# Patient Record
Sex: Male | Born: 1955 | Race: White | Hispanic: No | Marital: Married | Smoking: Never smoker
Health system: Southern US, Community
[De-identification: ages and names within clinical notes are randomized; demographics above are authoritative.]

## PROBLEM LIST (undated history)

## (undated) DIAGNOSIS — E782 Mixed hyperlipidemia: Secondary | ICD-10-CM

## (undated) DIAGNOSIS — U071 COVID-19: Secondary | ICD-10-CM

## (undated) DIAGNOSIS — G4733 Obstructive sleep apnea (adult) (pediatric): Secondary | ICD-10-CM

## (undated) DIAGNOSIS — D6851 Activated protein C resistance: Secondary | ICD-10-CM

## (undated) DIAGNOSIS — J8410 Pulmonary fibrosis, unspecified: Secondary | ICD-10-CM

## (undated) HISTORY — PX: ANKLE SURGERY: SHX546

## (undated) HISTORY — PX: TRACHELECTOMY: SHX6586

## (undated) HISTORY — PX: HERNIA REPAIR: SHX51

## (undated) HISTORY — PX: KNEE SURGERY: SHX244

---
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
## 2001-05-26 HISTORY — PX: INGUINAL HERNIA REPAIR: SUR1180

## 2015-05-27 HISTORY — PX: COLONOSCOPY: SHX174

## 2016-09-01 DIAGNOSIS — M1711 Unilateral primary osteoarthritis, right knee: Secondary | ICD-10-CM | POA: Insufficient documentation

## 2017-04-27 HISTORY — PX: KNEE ARTHROSCOPY W/ MENISCECTOMY: SHX1879

## 2017-05-14 DIAGNOSIS — Z4789 Encounter for other orthopedic aftercare: Secondary | ICD-10-CM | POA: Insufficient documentation

## 2018-05-08 DIAGNOSIS — G4700 Insomnia, unspecified: Secondary | ICD-10-CM | POA: Diagnosis not present

## 2018-05-08 DIAGNOSIS — I10 Essential (primary) hypertension: Secondary | ICD-10-CM | POA: Diagnosis not present

## 2018-05-08 DIAGNOSIS — Z79899 Other long term (current) drug therapy: Secondary | ICD-10-CM | POA: Diagnosis not present

## 2018-06-05 DIAGNOSIS — I10 Essential (primary) hypertension: Secondary | ICD-10-CM | POA: Diagnosis not present

## 2018-06-05 DIAGNOSIS — G4700 Insomnia, unspecified: Secondary | ICD-10-CM | POA: Diagnosis not present

## 2018-06-05 DIAGNOSIS — Z79899 Other long term (current) drug therapy: Secondary | ICD-10-CM | POA: Diagnosis not present

## 2018-11-22 DIAGNOSIS — M545 Low back pain: Secondary | ICD-10-CM | POA: Diagnosis not present

## 2019-01-28 DIAGNOSIS — Z23 Encounter for immunization: Secondary | ICD-10-CM | POA: Diagnosis not present

## 2019-01-28 DIAGNOSIS — R21 Rash and other nonspecific skin eruption: Secondary | ICD-10-CM | POA: Diagnosis not present

## 2019-01-28 DIAGNOSIS — E291 Testicular hypofunction: Secondary | ICD-10-CM | POA: Diagnosis not present

## 2019-01-28 DIAGNOSIS — L258 Unspecified contact dermatitis due to other agents: Secondary | ICD-10-CM | POA: Diagnosis not present

## 2019-04-02 DIAGNOSIS — Z114 Encounter for screening for human immunodeficiency virus [HIV]: Secondary | ICD-10-CM | POA: Diagnosis not present

## 2019-04-02 DIAGNOSIS — Z131 Encounter for screening for diabetes mellitus: Secondary | ICD-10-CM | POA: Diagnosis not present

## 2019-04-02 DIAGNOSIS — Z13220 Encounter for screening for lipoid disorders: Secondary | ICD-10-CM | POA: Diagnosis not present

## 2019-04-02 DIAGNOSIS — R5383 Other fatigue: Secondary | ICD-10-CM | POA: Diagnosis not present

## 2019-04-02 DIAGNOSIS — E559 Vitamin D deficiency, unspecified: Secondary | ICD-10-CM | POA: Diagnosis not present

## 2019-04-02 DIAGNOSIS — Z0000 Encounter for general adult medical examination without abnormal findings: Secondary | ICD-10-CM | POA: Diagnosis not present

## 2019-04-02 DIAGNOSIS — R0602 Shortness of breath: Secondary | ICD-10-CM | POA: Diagnosis not present

## 2019-04-02 DIAGNOSIS — Z125 Encounter for screening for malignant neoplasm of prostate: Secondary | ICD-10-CM | POA: Diagnosis not present

## 2019-04-02 DIAGNOSIS — Z1159 Encounter for screening for other viral diseases: Secondary | ICD-10-CM | POA: Diagnosis not present

## 2019-05-17 DIAGNOSIS — L218 Other seborrheic dermatitis: Secondary | ICD-10-CM | POA: Diagnosis not present

## 2019-05-17 DIAGNOSIS — L820 Inflamed seborrheic keratosis: Secondary | ICD-10-CM | POA: Diagnosis not present

## 2019-05-17 DIAGNOSIS — D485 Neoplasm of uncertain behavior of skin: Secondary | ICD-10-CM | POA: Diagnosis not present

## 2020-01-25 DIAGNOSIS — A4189 Other specified sepsis: Secondary | ICD-10-CM

## 2020-01-25 HISTORY — DX: COVID-19: A41.89

## 2020-01-28 DIAGNOSIS — R918 Other nonspecific abnormal finding of lung field: Secondary | ICD-10-CM | POA: Diagnosis not present

## 2020-01-29 DIAGNOSIS — E870 Hyperosmolality and hypernatremia: Secondary | ICD-10-CM | POA: Diagnosis not present

## 2020-01-29 DIAGNOSIS — Z9911 Dependence on respirator [ventilator] status: Secondary | ICD-10-CM | POA: Diagnosis not present

## 2020-01-29 DIAGNOSIS — J9601 Acute respiratory failure with hypoxia: Secondary | ICD-10-CM | POA: Diagnosis not present

## 2020-01-29 DIAGNOSIS — U071 COVID-19: Secondary | ICD-10-CM

## 2020-01-29 DIAGNOSIS — I7789 Other specified disorders of arteries and arterioles: Secondary | ICD-10-CM | POA: Diagnosis not present

## 2020-01-29 DIAGNOSIS — R6521 Severe sepsis with septic shock: Secondary | ICD-10-CM | POA: Diagnosis not present

## 2020-01-29 DIAGNOSIS — I808 Phlebitis and thrombophlebitis of other sites: Secondary | ICD-10-CM | POA: Diagnosis not present

## 2020-01-29 DIAGNOSIS — R9431 Abnormal electrocardiogram [ECG] [EKG]: Secondary | ICD-10-CM | POA: Diagnosis not present

## 2020-01-29 DIAGNOSIS — J9690 Respiratory failure, unspecified, unspecified whether with hypoxia or hypercapnia: Secondary | ICD-10-CM | POA: Diagnosis not present

## 2020-01-29 DIAGNOSIS — A4189 Other specified sepsis: Secondary | ICD-10-CM | POA: Diagnosis not present

## 2020-01-29 DIAGNOSIS — D682 Hereditary deficiency of other clotting factors: Secondary | ICD-10-CM | POA: Diagnosis not present

## 2020-01-29 DIAGNOSIS — N19 Unspecified kidney failure: Secondary | ICD-10-CM | POA: Diagnosis not present

## 2020-01-29 DIAGNOSIS — R000 Tachycardia, unspecified: Secondary | ICD-10-CM | POA: Diagnosis not present

## 2020-01-29 DIAGNOSIS — Z930 Tracheostomy status: Secondary | ICD-10-CM | POA: Diagnosis not present

## 2020-01-29 DIAGNOSIS — U099 Post covid-19 condition, unspecified: Secondary | ICD-10-CM | POA: Diagnosis not present

## 2020-01-29 DIAGNOSIS — R579 Shock, unspecified: Secondary | ICD-10-CM | POA: Diagnosis not present

## 2020-01-29 DIAGNOSIS — Z9981 Dependence on supplemental oxygen: Secondary | ICD-10-CM | POA: Diagnosis not present

## 2020-01-29 DIAGNOSIS — K5900 Constipation, unspecified: Secondary | ICD-10-CM | POA: Diagnosis not present

## 2020-01-29 DIAGNOSIS — R0902 Hypoxemia: Secondary | ICD-10-CM | POA: Diagnosis not present

## 2020-01-29 DIAGNOSIS — N179 Acute kidney failure, unspecified: Secondary | ICD-10-CM | POA: Diagnosis not present

## 2020-01-29 DIAGNOSIS — G928 Other toxic encephalopathy: Secondary | ICD-10-CM | POA: Diagnosis not present

## 2020-01-29 DIAGNOSIS — J90 Pleural effusion, not elsewhere classified: Secondary | ICD-10-CM | POA: Diagnosis not present

## 2020-01-29 DIAGNOSIS — Z781 Physical restraint status: Secondary | ICD-10-CM | POA: Diagnosis not present

## 2020-01-29 DIAGNOSIS — R918 Other nonspecific abnormal finding of lung field: Secondary | ICD-10-CM | POA: Diagnosis not present

## 2020-01-29 DIAGNOSIS — R0789 Other chest pain: Secondary | ICD-10-CM | POA: Diagnosis not present

## 2020-01-29 DIAGNOSIS — J80 Acute respiratory distress syndrome: Secondary | ICD-10-CM | POA: Diagnosis not present

## 2020-01-29 DIAGNOSIS — R079 Chest pain, unspecified: Secondary | ICD-10-CM | POA: Diagnosis not present

## 2020-01-29 DIAGNOSIS — Z978 Presence of other specified devices: Secondary | ICD-10-CM | POA: Diagnosis not present

## 2020-01-29 DIAGNOSIS — Z4659 Encounter for fitting and adjustment of other gastrointestinal appliance and device: Secondary | ICD-10-CM | POA: Diagnosis not present

## 2020-01-29 DIAGNOSIS — L89152 Pressure ulcer of sacral region, stage 2: Secondary | ICD-10-CM | POA: Diagnosis not present

## 2020-01-29 DIAGNOSIS — G4733 Obstructive sleep apnea (adult) (pediatric): Secondary | ICD-10-CM | POA: Diagnosis not present

## 2020-01-29 DIAGNOSIS — J95851 Ventilator associated pneumonia: Secondary | ICD-10-CM | POA: Diagnosis not present

## 2020-01-29 DIAGNOSIS — K6389 Other specified diseases of intestine: Secondary | ICD-10-CM | POA: Diagnosis not present

## 2020-01-29 DIAGNOSIS — E874 Mixed disorder of acid-base balance: Secondary | ICD-10-CM | POA: Diagnosis not present

## 2020-01-29 DIAGNOSIS — R7303 Prediabetes: Secondary | ICD-10-CM | POA: Diagnosis not present

## 2020-01-29 DIAGNOSIS — B9689 Other specified bacterial agents as the cause of diseases classified elsewhere: Secondary | ICD-10-CM | POA: Diagnosis not present

## 2020-01-29 DIAGNOSIS — R0602 Shortness of breath: Secondary | ICD-10-CM | POA: Diagnosis not present

## 2020-01-29 DIAGNOSIS — Z006 Encounter for examination for normal comparison and control in clinical research program: Secondary | ICD-10-CM | POA: Diagnosis not present

## 2020-01-29 DIAGNOSIS — J690 Pneumonitis due to inhalation of food and vomit: Secondary | ICD-10-CM | POA: Diagnosis not present

## 2020-01-29 DIAGNOSIS — D6851 Activated protein C resistance: Secondary | ICD-10-CM | POA: Diagnosis not present

## 2020-01-29 DIAGNOSIS — J1282 Pneumonia due to coronavirus disease 2019: Secondary | ICD-10-CM | POA: Diagnosis not present

## 2020-01-29 DIAGNOSIS — A419 Sepsis, unspecified organism: Secondary | ICD-10-CM | POA: Diagnosis not present

## 2020-01-29 DIAGNOSIS — J15211 Pneumonia due to Methicillin susceptible Staphylococcus aureus: Secondary | ICD-10-CM | POA: Diagnosis not present

## 2020-01-29 HISTORY — DX: Acute respiratory failure with hypoxia: J96.01

## 2020-01-29 HISTORY — DX: COVID-19: U07.1

## 2020-01-31 DIAGNOSIS — G4733 Obstructive sleep apnea (adult) (pediatric): Secondary | ICD-10-CM | POA: Insufficient documentation

## 2020-01-31 DIAGNOSIS — J9601 Acute respiratory failure with hypoxia: Secondary | ICD-10-CM | POA: Insufficient documentation

## 2020-02-01 DIAGNOSIS — A4189 Other specified sepsis: Secondary | ICD-10-CM | POA: Insufficient documentation

## 2020-02-07 DIAGNOSIS — J15211 Pneumonia due to Methicillin susceptible Staphylococcus aureus: Secondary | ICD-10-CM | POA: Insufficient documentation

## 2020-02-07 DIAGNOSIS — N179 Acute kidney failure, unspecified: Secondary | ICD-10-CM | POA: Insufficient documentation

## 2020-02-25 HISTORY — PX: TRACHEOSTOMY: SUR1362

## 2020-03-19 DIAGNOSIS — J398 Other specified diseases of upper respiratory tract: Secondary | ICD-10-CM | POA: Diagnosis not present

## 2020-03-19 DIAGNOSIS — R0902 Hypoxemia: Secondary | ICD-10-CM | POA: Diagnosis not present

## 2020-03-19 DIAGNOSIS — R54 Age-related physical debility: Secondary | ICD-10-CM | POA: Diagnosis not present

## 2020-03-19 DIAGNOSIS — Z7401 Bed confinement status: Secondary | ICD-10-CM | POA: Diagnosis not present

## 2020-03-19 DIAGNOSIS — G929 Unspecified toxic encephalopathy: Secondary | ICD-10-CM | POA: Diagnosis not present

## 2020-03-19 DIAGNOSIS — D638 Anemia in other chronic diseases classified elsewhere: Secondary | ICD-10-CM | POA: Diagnosis not present

## 2020-03-19 DIAGNOSIS — I5031 Acute diastolic (congestive) heart failure: Secondary | ICD-10-CM | POA: Diagnosis not present

## 2020-03-19 DIAGNOSIS — Z9981 Dependence on supplemental oxygen: Secondary | ICD-10-CM | POA: Diagnosis not present

## 2020-03-19 DIAGNOSIS — M2550 Pain in unspecified joint: Secondary | ICD-10-CM | POA: Diagnosis not present

## 2020-03-19 DIAGNOSIS — J849 Interstitial pulmonary disease, unspecified: Secondary | ICD-10-CM | POA: Diagnosis not present

## 2020-03-19 DIAGNOSIS — R5381 Other malaise: Secondary | ICD-10-CM | POA: Diagnosis not present

## 2020-03-19 DIAGNOSIS — G9340 Encephalopathy, unspecified: Secondary | ICD-10-CM | POA: Diagnosis not present

## 2020-03-19 DIAGNOSIS — J80 Acute respiratory distress syndrome: Secondary | ICD-10-CM | POA: Diagnosis not present

## 2020-03-19 DIAGNOSIS — J9621 Acute and chronic respiratory failure with hypoxia: Secondary | ICD-10-CM | POA: Diagnosis not present

## 2020-03-19 DIAGNOSIS — R911 Solitary pulmonary nodule: Secondary | ICD-10-CM | POA: Diagnosis not present

## 2020-03-19 DIAGNOSIS — G4733 Obstructive sleep apnea (adult) (pediatric): Secondary | ICD-10-CM | POA: Diagnosis not present

## 2020-03-19 DIAGNOSIS — J984 Other disorders of lung: Secondary | ICD-10-CM | POA: Diagnosis not present

## 2020-03-19 DIAGNOSIS — J9620 Acute and chronic respiratory failure, unspecified whether with hypoxia or hypercapnia: Secondary | ICD-10-CM | POA: Diagnosis not present

## 2020-03-19 DIAGNOSIS — G928 Other toxic encephalopathy: Secondary | ICD-10-CM | POA: Diagnosis not present

## 2020-03-19 DIAGNOSIS — J988 Other specified respiratory disorders: Secondary | ICD-10-CM | POA: Diagnosis not present

## 2020-03-19 DIAGNOSIS — L89152 Pressure ulcer of sacral region, stage 2: Secondary | ICD-10-CM | POA: Diagnosis not present

## 2020-03-19 DIAGNOSIS — Z930 Tracheostomy status: Secondary | ICD-10-CM | POA: Diagnosis not present

## 2020-03-19 DIAGNOSIS — J1282 Pneumonia due to coronavirus disease 2019: Secondary | ICD-10-CM | POA: Diagnosis not present

## 2020-03-19 DIAGNOSIS — J95851 Ventilator associated pneumonia: Secondary | ICD-10-CM | POA: Diagnosis not present

## 2020-03-19 DIAGNOSIS — U071 COVID-19: Secondary | ICD-10-CM | POA: Diagnosis not present

## 2020-03-19 DIAGNOSIS — Z9989 Dependence on other enabling machines and devices: Secondary | ICD-10-CM | POA: Diagnosis not present

## 2020-03-19 DIAGNOSIS — R1310 Dysphagia, unspecified: Secondary | ICD-10-CM | POA: Diagnosis not present

## 2020-03-19 DIAGNOSIS — A4189 Other specified sepsis: Secondary | ICD-10-CM | POA: Diagnosis not present

## 2020-03-19 DIAGNOSIS — U099 Post covid-19 condition, unspecified: Secondary | ICD-10-CM | POA: Diagnosis not present

## 2020-03-19 DIAGNOSIS — R1312 Dysphagia, oropharyngeal phase: Secondary | ICD-10-CM | POA: Diagnosis not present

## 2020-03-20 DIAGNOSIS — G4733 Obstructive sleep apnea (adult) (pediatric): Secondary | ICD-10-CM

## 2020-03-20 DIAGNOSIS — U071 COVID-19: Secondary | ICD-10-CM

## 2020-03-20 DIAGNOSIS — J9621 Acute and chronic respiratory failure with hypoxia: Secondary | ICD-10-CM

## 2020-03-20 DIAGNOSIS — R911 Solitary pulmonary nodule: Secondary | ICD-10-CM | POA: Diagnosis not present

## 2020-03-21 DIAGNOSIS — U071 COVID-19: Secondary | ICD-10-CM

## 2020-03-21 DIAGNOSIS — J9621 Acute and chronic respiratory failure with hypoxia: Secondary | ICD-10-CM

## 2020-03-21 DIAGNOSIS — G4733 Obstructive sleep apnea (adult) (pediatric): Secondary | ICD-10-CM

## 2020-03-21 DIAGNOSIS — R911 Solitary pulmonary nodule: Secondary | ICD-10-CM

## 2020-03-22 DIAGNOSIS — J9621 Acute and chronic respiratory failure with hypoxia: Secondary | ICD-10-CM | POA: Diagnosis not present

## 2020-03-22 DIAGNOSIS — G4733 Obstructive sleep apnea (adult) (pediatric): Secondary | ICD-10-CM

## 2020-03-22 DIAGNOSIS — U071 COVID-19: Secondary | ICD-10-CM

## 2020-03-22 DIAGNOSIS — R911 Solitary pulmonary nodule: Secondary | ICD-10-CM

## 2020-03-23 DIAGNOSIS — U071 COVID-19: Secondary | ICD-10-CM | POA: Diagnosis not present

## 2020-03-23 DIAGNOSIS — R911 Solitary pulmonary nodule: Secondary | ICD-10-CM | POA: Diagnosis not present

## 2020-03-23 DIAGNOSIS — G4733 Obstructive sleep apnea (adult) (pediatric): Secondary | ICD-10-CM

## 2020-03-23 DIAGNOSIS — J9621 Acute and chronic respiratory failure with hypoxia: Secondary | ICD-10-CM | POA: Diagnosis not present

## 2020-04-02 DIAGNOSIS — G4733 Obstructive sleep apnea (adult) (pediatric): Secondary | ICD-10-CM | POA: Diagnosis not present

## 2020-04-02 DIAGNOSIS — J9621 Acute and chronic respiratory failure with hypoxia: Secondary | ICD-10-CM

## 2020-04-02 DIAGNOSIS — U071 COVID-19: Secondary | ICD-10-CM | POA: Diagnosis not present

## 2020-04-02 DIAGNOSIS — R911 Solitary pulmonary nodule: Secondary | ICD-10-CM | POA: Diagnosis not present

## 2020-04-03 DIAGNOSIS — U071 COVID-19: Secondary | ICD-10-CM | POA: Diagnosis not present

## 2020-04-03 DIAGNOSIS — J9621 Acute and chronic respiratory failure with hypoxia: Secondary | ICD-10-CM

## 2020-04-03 DIAGNOSIS — G4733 Obstructive sleep apnea (adult) (pediatric): Secondary | ICD-10-CM

## 2020-04-03 DIAGNOSIS — R911 Solitary pulmonary nodule: Secondary | ICD-10-CM | POA: Diagnosis not present

## 2020-04-04 DIAGNOSIS — R911 Solitary pulmonary nodule: Secondary | ICD-10-CM | POA: Diagnosis not present

## 2020-04-04 DIAGNOSIS — U071 COVID-19: Secondary | ICD-10-CM | POA: Diagnosis not present

## 2020-04-04 DIAGNOSIS — G4733 Obstructive sleep apnea (adult) (pediatric): Secondary | ICD-10-CM

## 2020-04-04 DIAGNOSIS — J9621 Acute and chronic respiratory failure with hypoxia: Secondary | ICD-10-CM | POA: Diagnosis not present

## 2020-04-05 DIAGNOSIS — U071 COVID-19: Secondary | ICD-10-CM | POA: Diagnosis not present

## 2020-04-05 DIAGNOSIS — G4733 Obstructive sleep apnea (adult) (pediatric): Secondary | ICD-10-CM

## 2020-04-05 DIAGNOSIS — R911 Solitary pulmonary nodule: Secondary | ICD-10-CM | POA: Diagnosis not present

## 2020-04-05 DIAGNOSIS — J9621 Acute and chronic respiratory failure with hypoxia: Secondary | ICD-10-CM

## 2020-04-06 DIAGNOSIS — G4733 Obstructive sleep apnea (adult) (pediatric): Secondary | ICD-10-CM

## 2020-04-06 DIAGNOSIS — R911 Solitary pulmonary nodule: Secondary | ICD-10-CM

## 2020-04-06 DIAGNOSIS — J9621 Acute and chronic respiratory failure with hypoxia: Secondary | ICD-10-CM | POA: Diagnosis not present

## 2020-04-06 DIAGNOSIS — U071 COVID-19: Secondary | ICD-10-CM | POA: Diagnosis not present

## 2020-04-07 DIAGNOSIS — R911 Solitary pulmonary nodule: Secondary | ICD-10-CM | POA: Diagnosis not present

## 2020-04-07 DIAGNOSIS — G4733 Obstructive sleep apnea (adult) (pediatric): Secondary | ICD-10-CM

## 2020-04-07 DIAGNOSIS — U071 COVID-19: Secondary | ICD-10-CM

## 2020-04-07 DIAGNOSIS — J9621 Acute and chronic respiratory failure with hypoxia: Secondary | ICD-10-CM

## 2020-04-08 DIAGNOSIS — J9621 Acute and chronic respiratory failure with hypoxia: Secondary | ICD-10-CM

## 2020-04-08 DIAGNOSIS — U071 COVID-19: Secondary | ICD-10-CM

## 2020-04-08 DIAGNOSIS — G4733 Obstructive sleep apnea (adult) (pediatric): Secondary | ICD-10-CM

## 2020-04-08 DIAGNOSIS — R911 Solitary pulmonary nodule: Secondary | ICD-10-CM | POA: Diagnosis not present

## 2020-04-11 DIAGNOSIS — J849 Interstitial pulmonary disease, unspecified: Secondary | ICD-10-CM | POA: Diagnosis not present

## 2020-04-12 DIAGNOSIS — D649 Anemia, unspecified: Secondary | ICD-10-CM | POA: Diagnosis not present

## 2020-04-12 DIAGNOSIS — J1282 Pneumonia due to coronavirus disease 2019: Secondary | ICD-10-CM | POA: Diagnosis not present

## 2020-04-12 DIAGNOSIS — R1310 Dysphagia, unspecified: Secondary | ICD-10-CM | POA: Diagnosis not present

## 2020-04-12 DIAGNOSIS — Z930 Tracheostomy status: Secondary | ICD-10-CM | POA: Diagnosis not present

## 2020-04-12 DIAGNOSIS — Z7982 Long term (current) use of aspirin: Secondary | ICD-10-CM | POA: Diagnosis not present

## 2020-04-12 DIAGNOSIS — Z9981 Dependence on supplemental oxygen: Secondary | ICD-10-CM | POA: Diagnosis not present

## 2020-04-12 DIAGNOSIS — U071 COVID-19: Secondary | ICD-10-CM | POA: Diagnosis not present

## 2020-04-12 DIAGNOSIS — G4733 Obstructive sleep apnea (adult) (pediatric): Secondary | ICD-10-CM | POA: Diagnosis not present

## 2020-04-12 DIAGNOSIS — R1313 Dysphagia, pharyngeal phase: Secondary | ICD-10-CM | POA: Diagnosis not present

## 2020-04-13 DIAGNOSIS — L89152 Pressure ulcer of sacral region, stage 2: Secondary | ICD-10-CM | POA: Diagnosis not present

## 2020-04-13 DIAGNOSIS — Z131 Encounter for screening for diabetes mellitus: Secondary | ICD-10-CM | POA: Diagnosis not present

## 2020-04-13 DIAGNOSIS — Z0001 Encounter for general adult medical examination with abnormal findings: Secondary | ICD-10-CM | POA: Diagnosis not present

## 2020-04-13 DIAGNOSIS — R5381 Other malaise: Secondary | ICD-10-CM | POA: Diagnosis not present

## 2020-04-13 DIAGNOSIS — G4733 Obstructive sleep apnea (adult) (pediatric): Secondary | ICD-10-CM | POA: Diagnosis not present

## 2020-04-13 DIAGNOSIS — Z136 Encounter for screening for cardiovascular disorders: Secondary | ICD-10-CM | POA: Diagnosis not present

## 2020-04-13 DIAGNOSIS — Z01118 Encounter for examination of ears and hearing with other abnormal findings: Secondary | ICD-10-CM | POA: Diagnosis not present

## 2020-04-16 DIAGNOSIS — R1310 Dysphagia, unspecified: Secondary | ICD-10-CM | POA: Diagnosis not present

## 2020-04-16 DIAGNOSIS — R1313 Dysphagia, pharyngeal phase: Secondary | ICD-10-CM | POA: Diagnosis not present

## 2020-04-16 DIAGNOSIS — R5381 Other malaise: Secondary | ICD-10-CM | POA: Diagnosis not present

## 2020-04-16 DIAGNOSIS — D649 Anemia, unspecified: Secondary | ICD-10-CM | POA: Diagnosis not present

## 2020-04-16 DIAGNOSIS — U071 COVID-19: Secondary | ICD-10-CM | POA: Diagnosis not present

## 2020-04-16 DIAGNOSIS — G4733 Obstructive sleep apnea (adult) (pediatric): Secondary | ICD-10-CM | POA: Diagnosis not present

## 2020-04-16 DIAGNOSIS — Z930 Tracheostomy status: Secondary | ICD-10-CM | POA: Diagnosis not present

## 2020-04-16 DIAGNOSIS — Z9981 Dependence on supplemental oxygen: Secondary | ICD-10-CM | POA: Diagnosis not present

## 2020-04-16 DIAGNOSIS — J1282 Pneumonia due to coronavirus disease 2019: Secondary | ICD-10-CM | POA: Diagnosis not present

## 2020-04-16 DIAGNOSIS — Z7982 Long term (current) use of aspirin: Secondary | ICD-10-CM | POA: Diagnosis not present

## 2020-04-16 DIAGNOSIS — G928 Other toxic encephalopathy: Secondary | ICD-10-CM | POA: Diagnosis not present

## 2020-04-17 DIAGNOSIS — Z930 Tracheostomy status: Secondary | ICD-10-CM | POA: Diagnosis not present

## 2020-04-17 DIAGNOSIS — Z9981 Dependence on supplemental oxygen: Secondary | ICD-10-CM | POA: Diagnosis not present

## 2020-04-17 DIAGNOSIS — R1310 Dysphagia, unspecified: Secondary | ICD-10-CM | POA: Diagnosis not present

## 2020-04-17 DIAGNOSIS — D649 Anemia, unspecified: Secondary | ICD-10-CM | POA: Diagnosis not present

## 2020-04-17 DIAGNOSIS — R5381 Other malaise: Secondary | ICD-10-CM | POA: Diagnosis not present

## 2020-04-17 DIAGNOSIS — R1313 Dysphagia, pharyngeal phase: Secondary | ICD-10-CM | POA: Diagnosis not present

## 2020-04-17 DIAGNOSIS — U071 COVID-19: Secondary | ICD-10-CM | POA: Diagnosis not present

## 2020-04-17 DIAGNOSIS — Z7982 Long term (current) use of aspirin: Secondary | ICD-10-CM | POA: Diagnosis not present

## 2020-04-17 DIAGNOSIS — G928 Other toxic encephalopathy: Secondary | ICD-10-CM | POA: Diagnosis not present

## 2020-04-17 DIAGNOSIS — J1282 Pneumonia due to coronavirus disease 2019: Secondary | ICD-10-CM | POA: Diagnosis not present

## 2020-04-17 DIAGNOSIS — G4733 Obstructive sleep apnea (adult) (pediatric): Secondary | ICD-10-CM | POA: Diagnosis not present

## 2020-04-18 DIAGNOSIS — G928 Other toxic encephalopathy: Secondary | ICD-10-CM | POA: Diagnosis not present

## 2020-04-18 DIAGNOSIS — R5381 Other malaise: Secondary | ICD-10-CM | POA: Diagnosis not present

## 2020-04-23 DIAGNOSIS — R1310 Dysphagia, unspecified: Secondary | ICD-10-CM | POA: Diagnosis not present

## 2020-04-23 DIAGNOSIS — J1282 Pneumonia due to coronavirus disease 2019: Secondary | ICD-10-CM | POA: Diagnosis not present

## 2020-04-23 DIAGNOSIS — G4733 Obstructive sleep apnea (adult) (pediatric): Secondary | ICD-10-CM | POA: Diagnosis not present

## 2020-04-23 DIAGNOSIS — R1313 Dysphagia, pharyngeal phase: Secondary | ICD-10-CM | POA: Diagnosis not present

## 2020-04-23 DIAGNOSIS — U071 COVID-19: Secondary | ICD-10-CM | POA: Diagnosis not present

## 2020-04-23 DIAGNOSIS — Z7982 Long term (current) use of aspirin: Secondary | ICD-10-CM | POA: Diagnosis not present

## 2020-04-23 DIAGNOSIS — Z930 Tracheostomy status: Secondary | ICD-10-CM | POA: Diagnosis not present

## 2020-04-23 DIAGNOSIS — Z9981 Dependence on supplemental oxygen: Secondary | ICD-10-CM | POA: Diagnosis not present

## 2020-04-23 DIAGNOSIS — D649 Anemia, unspecified: Secondary | ICD-10-CM | POA: Diagnosis not present

## 2020-04-24 DIAGNOSIS — Z9981 Dependence on supplemental oxygen: Secondary | ICD-10-CM | POA: Diagnosis not present

## 2020-04-24 DIAGNOSIS — D649 Anemia, unspecified: Secondary | ICD-10-CM | POA: Diagnosis not present

## 2020-04-24 DIAGNOSIS — G4733 Obstructive sleep apnea (adult) (pediatric): Secondary | ICD-10-CM | POA: Diagnosis not present

## 2020-04-24 DIAGNOSIS — R1310 Dysphagia, unspecified: Secondary | ICD-10-CM | POA: Diagnosis not present

## 2020-04-24 DIAGNOSIS — R1313 Dysphagia, pharyngeal phase: Secondary | ICD-10-CM | POA: Diagnosis not present

## 2020-04-24 DIAGNOSIS — Z930 Tracheostomy status: Secondary | ICD-10-CM | POA: Diagnosis not present

## 2020-04-24 DIAGNOSIS — U071 COVID-19: Secondary | ICD-10-CM | POA: Diagnosis not present

## 2020-04-24 DIAGNOSIS — J1282 Pneumonia due to coronavirus disease 2019: Secondary | ICD-10-CM | POA: Diagnosis not present

## 2020-04-24 DIAGNOSIS — Z7982 Long term (current) use of aspirin: Secondary | ICD-10-CM | POA: Diagnosis not present

## 2020-04-27 DIAGNOSIS — J1282 Pneumonia due to coronavirus disease 2019: Secondary | ICD-10-CM | POA: Diagnosis not present

## 2020-04-27 DIAGNOSIS — U071 COVID-19: Secondary | ICD-10-CM | POA: Diagnosis not present

## 2020-04-27 DIAGNOSIS — Z930 Tracheostomy status: Secondary | ICD-10-CM | POA: Diagnosis not present

## 2020-04-27 DIAGNOSIS — R1313 Dysphagia, pharyngeal phase: Secondary | ICD-10-CM | POA: Diagnosis not present

## 2020-04-27 DIAGNOSIS — D649 Anemia, unspecified: Secondary | ICD-10-CM | POA: Diagnosis not present

## 2020-04-27 DIAGNOSIS — Z9981 Dependence on supplemental oxygen: Secondary | ICD-10-CM | POA: Diagnosis not present

## 2020-04-27 DIAGNOSIS — Z7982 Long term (current) use of aspirin: Secondary | ICD-10-CM | POA: Diagnosis not present

## 2020-04-27 DIAGNOSIS — G4733 Obstructive sleep apnea (adult) (pediatric): Secondary | ICD-10-CM | POA: Diagnosis not present

## 2020-04-27 DIAGNOSIS — R1310 Dysphagia, unspecified: Secondary | ICD-10-CM | POA: Diagnosis not present

## 2020-04-30 DIAGNOSIS — R1313 Dysphagia, pharyngeal phase: Secondary | ICD-10-CM | POA: Diagnosis not present

## 2020-04-30 DIAGNOSIS — U071 COVID-19: Secondary | ICD-10-CM | POA: Diagnosis not present

## 2020-04-30 DIAGNOSIS — J1282 Pneumonia due to coronavirus disease 2019: Secondary | ICD-10-CM | POA: Diagnosis not present

## 2020-04-30 DIAGNOSIS — Z930 Tracheostomy status: Secondary | ICD-10-CM | POA: Diagnosis not present

## 2020-04-30 DIAGNOSIS — R1310 Dysphagia, unspecified: Secondary | ICD-10-CM | POA: Diagnosis not present

## 2020-04-30 DIAGNOSIS — Z7982 Long term (current) use of aspirin: Secondary | ICD-10-CM | POA: Diagnosis not present

## 2020-04-30 DIAGNOSIS — D649 Anemia, unspecified: Secondary | ICD-10-CM | POA: Diagnosis not present

## 2020-04-30 DIAGNOSIS — Z9981 Dependence on supplemental oxygen: Secondary | ICD-10-CM | POA: Diagnosis not present

## 2020-04-30 DIAGNOSIS — G4733 Obstructive sleep apnea (adult) (pediatric): Secondary | ICD-10-CM | POA: Diagnosis not present

## 2020-05-01 DIAGNOSIS — Z9981 Dependence on supplemental oxygen: Secondary | ICD-10-CM | POA: Diagnosis not present

## 2020-05-01 DIAGNOSIS — Z930 Tracheostomy status: Secondary | ICD-10-CM | POA: Diagnosis not present

## 2020-05-01 DIAGNOSIS — U071 COVID-19: Secondary | ICD-10-CM | POA: Diagnosis not present

## 2020-05-01 DIAGNOSIS — R1313 Dysphagia, pharyngeal phase: Secondary | ICD-10-CM | POA: Diagnosis not present

## 2020-05-01 DIAGNOSIS — Z7982 Long term (current) use of aspirin: Secondary | ICD-10-CM | POA: Diagnosis not present

## 2020-05-01 DIAGNOSIS — D649 Anemia, unspecified: Secondary | ICD-10-CM | POA: Diagnosis not present

## 2020-05-01 DIAGNOSIS — G4733 Obstructive sleep apnea (adult) (pediatric): Secondary | ICD-10-CM | POA: Diagnosis not present

## 2020-05-01 DIAGNOSIS — R1310 Dysphagia, unspecified: Secondary | ICD-10-CM | POA: Diagnosis not present

## 2020-05-01 DIAGNOSIS — J1282 Pneumonia due to coronavirus disease 2019: Secondary | ICD-10-CM | POA: Diagnosis not present

## 2020-05-02 DIAGNOSIS — U071 COVID-19: Secondary | ICD-10-CM | POA: Diagnosis not present

## 2020-05-02 DIAGNOSIS — Z9981 Dependence on supplemental oxygen: Secondary | ICD-10-CM | POA: Diagnosis not present

## 2020-05-02 DIAGNOSIS — D649 Anemia, unspecified: Secondary | ICD-10-CM | POA: Diagnosis not present

## 2020-05-02 DIAGNOSIS — Z930 Tracheostomy status: Secondary | ICD-10-CM | POA: Diagnosis not present

## 2020-05-02 DIAGNOSIS — J1282 Pneumonia due to coronavirus disease 2019: Secondary | ICD-10-CM | POA: Diagnosis not present

## 2020-05-02 DIAGNOSIS — R1313 Dysphagia, pharyngeal phase: Secondary | ICD-10-CM | POA: Diagnosis not present

## 2020-05-02 DIAGNOSIS — Z7982 Long term (current) use of aspirin: Secondary | ICD-10-CM | POA: Diagnosis not present

## 2020-05-02 DIAGNOSIS — R1310 Dysphagia, unspecified: Secondary | ICD-10-CM | POA: Diagnosis not present

## 2020-05-02 DIAGNOSIS — G4733 Obstructive sleep apnea (adult) (pediatric): Secondary | ICD-10-CM | POA: Diagnosis not present

## 2020-05-07 DIAGNOSIS — Z9981 Dependence on supplemental oxygen: Secondary | ICD-10-CM | POA: Diagnosis not present

## 2020-05-07 DIAGNOSIS — Z930 Tracheostomy status: Secondary | ICD-10-CM | POA: Diagnosis not present

## 2020-05-07 DIAGNOSIS — R1313 Dysphagia, pharyngeal phase: Secondary | ICD-10-CM | POA: Diagnosis not present

## 2020-05-07 DIAGNOSIS — G4733 Obstructive sleep apnea (adult) (pediatric): Secondary | ICD-10-CM | POA: Diagnosis not present

## 2020-05-07 DIAGNOSIS — D649 Anemia, unspecified: Secondary | ICD-10-CM | POA: Diagnosis not present

## 2020-05-07 DIAGNOSIS — U071 COVID-19: Secondary | ICD-10-CM | POA: Diagnosis not present

## 2020-05-07 DIAGNOSIS — R1310 Dysphagia, unspecified: Secondary | ICD-10-CM | POA: Diagnosis not present

## 2020-05-07 DIAGNOSIS — Z7982 Long term (current) use of aspirin: Secondary | ICD-10-CM | POA: Diagnosis not present

## 2020-05-07 DIAGNOSIS — J1282 Pneumonia due to coronavirus disease 2019: Secondary | ICD-10-CM | POA: Diagnosis not present

## 2020-05-09 DIAGNOSIS — D649 Anemia, unspecified: Secondary | ICD-10-CM | POA: Diagnosis not present

## 2020-05-09 DIAGNOSIS — Z7982 Long term (current) use of aspirin: Secondary | ICD-10-CM | POA: Diagnosis not present

## 2020-05-09 DIAGNOSIS — G4733 Obstructive sleep apnea (adult) (pediatric): Secondary | ICD-10-CM | POA: Diagnosis not present

## 2020-05-09 DIAGNOSIS — R1310 Dysphagia, unspecified: Secondary | ICD-10-CM | POA: Diagnosis not present

## 2020-05-09 DIAGNOSIS — U071 COVID-19: Secondary | ICD-10-CM | POA: Diagnosis not present

## 2020-05-09 DIAGNOSIS — Z930 Tracheostomy status: Secondary | ICD-10-CM | POA: Diagnosis not present

## 2020-05-09 DIAGNOSIS — Z9981 Dependence on supplemental oxygen: Secondary | ICD-10-CM | POA: Diagnosis not present

## 2020-05-09 DIAGNOSIS — J1282 Pneumonia due to coronavirus disease 2019: Secondary | ICD-10-CM | POA: Diagnosis not present

## 2020-05-09 DIAGNOSIS — R1313 Dysphagia, pharyngeal phase: Secondary | ICD-10-CM | POA: Diagnosis not present

## 2020-05-10 DIAGNOSIS — Z930 Tracheostomy status: Secondary | ICD-10-CM | POA: Diagnosis not present

## 2020-05-10 DIAGNOSIS — J1282 Pneumonia due to coronavirus disease 2019: Secondary | ICD-10-CM | POA: Diagnosis not present

## 2020-05-10 DIAGNOSIS — R1313 Dysphagia, pharyngeal phase: Secondary | ICD-10-CM | POA: Diagnosis not present

## 2020-05-10 DIAGNOSIS — D649 Anemia, unspecified: Secondary | ICD-10-CM | POA: Diagnosis not present

## 2020-05-10 DIAGNOSIS — U071 COVID-19: Secondary | ICD-10-CM | POA: Diagnosis not present

## 2020-05-10 DIAGNOSIS — G4733 Obstructive sleep apnea (adult) (pediatric): Secondary | ICD-10-CM | POA: Diagnosis not present

## 2020-05-10 DIAGNOSIS — R1310 Dysphagia, unspecified: Secondary | ICD-10-CM | POA: Diagnosis not present

## 2020-05-10 DIAGNOSIS — Z9981 Dependence on supplemental oxygen: Secondary | ICD-10-CM | POA: Diagnosis not present

## 2020-05-10 DIAGNOSIS — Z7982 Long term (current) use of aspirin: Secondary | ICD-10-CM | POA: Diagnosis not present

## 2020-05-11 DIAGNOSIS — R1313 Dysphagia, pharyngeal phase: Secondary | ICD-10-CM | POA: Diagnosis not present

## 2020-05-11 DIAGNOSIS — Z7982 Long term (current) use of aspirin: Secondary | ICD-10-CM | POA: Diagnosis not present

## 2020-05-11 DIAGNOSIS — D649 Anemia, unspecified: Secondary | ICD-10-CM | POA: Diagnosis not present

## 2020-05-11 DIAGNOSIS — U071 COVID-19: Secondary | ICD-10-CM | POA: Diagnosis not present

## 2020-05-11 DIAGNOSIS — J9620 Acute and chronic respiratory failure, unspecified whether with hypoxia or hypercapnia: Secondary | ICD-10-CM | POA: Diagnosis not present

## 2020-05-11 DIAGNOSIS — Z930 Tracheostomy status: Secondary | ICD-10-CM | POA: Diagnosis not present

## 2020-05-11 DIAGNOSIS — R1310 Dysphagia, unspecified: Secondary | ICD-10-CM | POA: Diagnosis not present

## 2020-05-11 DIAGNOSIS — J849 Interstitial pulmonary disease, unspecified: Secondary | ICD-10-CM | POA: Diagnosis not present

## 2020-05-11 DIAGNOSIS — Z9981 Dependence on supplemental oxygen: Secondary | ICD-10-CM | POA: Diagnosis not present

## 2020-05-11 DIAGNOSIS — J1282 Pneumonia due to coronavirus disease 2019: Secondary | ICD-10-CM | POA: Diagnosis not present

## 2020-05-11 DIAGNOSIS — G4733 Obstructive sleep apnea (adult) (pediatric): Secondary | ICD-10-CM | POA: Diagnosis not present

## 2020-05-14 DIAGNOSIS — Z9981 Dependence on supplemental oxygen: Secondary | ICD-10-CM | POA: Diagnosis not present

## 2020-05-14 DIAGNOSIS — Z930 Tracheostomy status: Secondary | ICD-10-CM | POA: Diagnosis not present

## 2020-05-14 DIAGNOSIS — D649 Anemia, unspecified: Secondary | ICD-10-CM | POA: Diagnosis not present

## 2020-05-14 DIAGNOSIS — R1310 Dysphagia, unspecified: Secondary | ICD-10-CM | POA: Diagnosis not present

## 2020-05-14 DIAGNOSIS — U071 COVID-19: Secondary | ICD-10-CM | POA: Diagnosis not present

## 2020-05-14 DIAGNOSIS — R1313 Dysphagia, pharyngeal phase: Secondary | ICD-10-CM | POA: Diagnosis not present

## 2020-05-14 DIAGNOSIS — Z7982 Long term (current) use of aspirin: Secondary | ICD-10-CM | POA: Diagnosis not present

## 2020-05-14 DIAGNOSIS — G4733 Obstructive sleep apnea (adult) (pediatric): Secondary | ICD-10-CM | POA: Diagnosis not present

## 2020-05-14 DIAGNOSIS — J1282 Pneumonia due to coronavirus disease 2019: Secondary | ICD-10-CM | POA: Diagnosis not present

## 2020-05-15 DIAGNOSIS — J1282 Pneumonia due to coronavirus disease 2019: Secondary | ICD-10-CM | POA: Diagnosis not present

## 2020-05-15 DIAGNOSIS — U071 COVID-19: Secondary | ICD-10-CM | POA: Diagnosis not present

## 2020-05-15 DIAGNOSIS — R1310 Dysphagia, unspecified: Secondary | ICD-10-CM | POA: Diagnosis not present

## 2020-05-15 DIAGNOSIS — D649 Anemia, unspecified: Secondary | ICD-10-CM | POA: Diagnosis not present

## 2020-05-15 DIAGNOSIS — Z7982 Long term (current) use of aspirin: Secondary | ICD-10-CM | POA: Diagnosis not present

## 2020-05-15 DIAGNOSIS — Z930 Tracheostomy status: Secondary | ICD-10-CM | POA: Diagnosis not present

## 2020-05-15 DIAGNOSIS — Z9981 Dependence on supplemental oxygen: Secondary | ICD-10-CM | POA: Diagnosis not present

## 2020-05-15 DIAGNOSIS — G4733 Obstructive sleep apnea (adult) (pediatric): Secondary | ICD-10-CM | POA: Diagnosis not present

## 2020-05-15 DIAGNOSIS — R1313 Dysphagia, pharyngeal phase: Secondary | ICD-10-CM | POA: Diagnosis not present

## 2020-05-16 DIAGNOSIS — R1313 Dysphagia, pharyngeal phase: Secondary | ICD-10-CM | POA: Diagnosis not present

## 2020-05-16 DIAGNOSIS — U071 COVID-19: Secondary | ICD-10-CM | POA: Diagnosis not present

## 2020-05-16 DIAGNOSIS — Z7982 Long term (current) use of aspirin: Secondary | ICD-10-CM | POA: Diagnosis not present

## 2020-05-16 DIAGNOSIS — D649 Anemia, unspecified: Secondary | ICD-10-CM | POA: Diagnosis not present

## 2020-05-16 DIAGNOSIS — Z930 Tracheostomy status: Secondary | ICD-10-CM | POA: Diagnosis not present

## 2020-05-16 DIAGNOSIS — G4733 Obstructive sleep apnea (adult) (pediatric): Secondary | ICD-10-CM | POA: Diagnosis not present

## 2020-05-16 DIAGNOSIS — J1282 Pneumonia due to coronavirus disease 2019: Secondary | ICD-10-CM | POA: Diagnosis not present

## 2020-05-16 DIAGNOSIS — R1310 Dysphagia, unspecified: Secondary | ICD-10-CM | POA: Diagnosis not present

## 2020-05-16 DIAGNOSIS — Z9981 Dependence on supplemental oxygen: Secondary | ICD-10-CM | POA: Diagnosis not present

## 2020-05-17 DIAGNOSIS — U071 COVID-19: Secondary | ICD-10-CM | POA: Diagnosis not present

## 2020-05-17 DIAGNOSIS — D649 Anemia, unspecified: Secondary | ICD-10-CM | POA: Diagnosis not present

## 2020-05-17 DIAGNOSIS — R1313 Dysphagia, pharyngeal phase: Secondary | ICD-10-CM | POA: Diagnosis not present

## 2020-05-17 DIAGNOSIS — G4733 Obstructive sleep apnea (adult) (pediatric): Secondary | ICD-10-CM | POA: Diagnosis not present

## 2020-05-17 DIAGNOSIS — Z930 Tracheostomy status: Secondary | ICD-10-CM | POA: Diagnosis not present

## 2020-05-17 DIAGNOSIS — R1310 Dysphagia, unspecified: Secondary | ICD-10-CM | POA: Diagnosis not present

## 2020-05-17 DIAGNOSIS — J1282 Pneumonia due to coronavirus disease 2019: Secondary | ICD-10-CM | POA: Diagnosis not present

## 2020-05-17 DIAGNOSIS — Z9981 Dependence on supplemental oxygen: Secondary | ICD-10-CM | POA: Diagnosis not present

## 2020-05-17 DIAGNOSIS — Z7982 Long term (current) use of aspirin: Secondary | ICD-10-CM | POA: Diagnosis not present

## 2020-05-21 DIAGNOSIS — D649 Anemia, unspecified: Secondary | ICD-10-CM | POA: Diagnosis not present

## 2020-05-21 DIAGNOSIS — Z930 Tracheostomy status: Secondary | ICD-10-CM | POA: Diagnosis not present

## 2020-05-21 DIAGNOSIS — Z9981 Dependence on supplemental oxygen: Secondary | ICD-10-CM | POA: Diagnosis not present

## 2020-05-21 DIAGNOSIS — U071 COVID-19: Secondary | ICD-10-CM | POA: Diagnosis not present

## 2020-05-21 DIAGNOSIS — J1282 Pneumonia due to coronavirus disease 2019: Secondary | ICD-10-CM | POA: Diagnosis not present

## 2020-05-21 DIAGNOSIS — G4733 Obstructive sleep apnea (adult) (pediatric): Secondary | ICD-10-CM | POA: Diagnosis not present

## 2020-05-21 DIAGNOSIS — Z7982 Long term (current) use of aspirin: Secondary | ICD-10-CM | POA: Diagnosis not present

## 2020-05-21 DIAGNOSIS — R1310 Dysphagia, unspecified: Secondary | ICD-10-CM | POA: Diagnosis not present

## 2020-05-21 DIAGNOSIS — R1313 Dysphagia, pharyngeal phase: Secondary | ICD-10-CM | POA: Diagnosis not present

## 2020-05-23 DIAGNOSIS — J1282 Pneumonia due to coronavirus disease 2019: Secondary | ICD-10-CM | POA: Diagnosis not present

## 2020-05-23 DIAGNOSIS — U071 COVID-19: Secondary | ICD-10-CM | POA: Diagnosis not present

## 2020-05-23 DIAGNOSIS — D649 Anemia, unspecified: Secondary | ICD-10-CM | POA: Diagnosis not present

## 2020-05-23 DIAGNOSIS — Z9981 Dependence on supplemental oxygen: Secondary | ICD-10-CM | POA: Diagnosis not present

## 2020-05-23 DIAGNOSIS — R1313 Dysphagia, pharyngeal phase: Secondary | ICD-10-CM | POA: Diagnosis not present

## 2020-05-23 DIAGNOSIS — Z930 Tracheostomy status: Secondary | ICD-10-CM | POA: Diagnosis not present

## 2020-05-23 DIAGNOSIS — Z7982 Long term (current) use of aspirin: Secondary | ICD-10-CM | POA: Diagnosis not present

## 2020-05-23 DIAGNOSIS — R1310 Dysphagia, unspecified: Secondary | ICD-10-CM | POA: Diagnosis not present

## 2020-05-23 DIAGNOSIS — G4733 Obstructive sleep apnea (adult) (pediatric): Secondary | ICD-10-CM | POA: Diagnosis not present

## 2020-05-25 DIAGNOSIS — J1282 Pneumonia due to coronavirus disease 2019: Secondary | ICD-10-CM | POA: Diagnosis not present

## 2020-05-25 DIAGNOSIS — R1313 Dysphagia, pharyngeal phase: Secondary | ICD-10-CM | POA: Diagnosis not present

## 2020-05-25 DIAGNOSIS — Z7982 Long term (current) use of aspirin: Secondary | ICD-10-CM | POA: Diagnosis not present

## 2020-05-25 DIAGNOSIS — D649 Anemia, unspecified: Secondary | ICD-10-CM | POA: Diagnosis not present

## 2020-05-25 DIAGNOSIS — Z930 Tracheostomy status: Secondary | ICD-10-CM | POA: Diagnosis not present

## 2020-05-25 DIAGNOSIS — R1310 Dysphagia, unspecified: Secondary | ICD-10-CM | POA: Diagnosis not present

## 2020-05-25 DIAGNOSIS — G4733 Obstructive sleep apnea (adult) (pediatric): Secondary | ICD-10-CM | POA: Diagnosis not present

## 2020-05-25 DIAGNOSIS — Z9981 Dependence on supplemental oxygen: Secondary | ICD-10-CM | POA: Diagnosis not present

## 2020-05-25 DIAGNOSIS — U071 COVID-19: Secondary | ICD-10-CM | POA: Diagnosis not present

## 2020-05-29 DIAGNOSIS — Z7982 Long term (current) use of aspirin: Secondary | ICD-10-CM | POA: Diagnosis not present

## 2020-05-29 DIAGNOSIS — G4733 Obstructive sleep apnea (adult) (pediatric): Secondary | ICD-10-CM | POA: Diagnosis not present

## 2020-05-29 DIAGNOSIS — Z9981 Dependence on supplemental oxygen: Secondary | ICD-10-CM | POA: Diagnosis not present

## 2020-05-29 DIAGNOSIS — D649 Anemia, unspecified: Secondary | ICD-10-CM | POA: Diagnosis not present

## 2020-05-29 DIAGNOSIS — R1313 Dysphagia, pharyngeal phase: Secondary | ICD-10-CM | POA: Diagnosis not present

## 2020-05-29 DIAGNOSIS — J1282 Pneumonia due to coronavirus disease 2019: Secondary | ICD-10-CM | POA: Diagnosis not present

## 2020-05-29 DIAGNOSIS — Z930 Tracheostomy status: Secondary | ICD-10-CM | POA: Diagnosis not present

## 2020-05-29 DIAGNOSIS — R1310 Dysphagia, unspecified: Secondary | ICD-10-CM | POA: Diagnosis not present

## 2020-05-29 DIAGNOSIS — U071 COVID-19: Secondary | ICD-10-CM | POA: Diagnosis not present

## 2020-05-30 DIAGNOSIS — U071 COVID-19: Secondary | ICD-10-CM | POA: Diagnosis not present

## 2020-05-30 DIAGNOSIS — J1282 Pneumonia due to coronavirus disease 2019: Secondary | ICD-10-CM | POA: Diagnosis not present

## 2020-05-30 DIAGNOSIS — D649 Anemia, unspecified: Secondary | ICD-10-CM | POA: Diagnosis not present

## 2020-05-30 DIAGNOSIS — R1313 Dysphagia, pharyngeal phase: Secondary | ICD-10-CM | POA: Diagnosis not present

## 2020-05-30 DIAGNOSIS — Z930 Tracheostomy status: Secondary | ICD-10-CM | POA: Diagnosis not present

## 2020-05-30 DIAGNOSIS — Z7982 Long term (current) use of aspirin: Secondary | ICD-10-CM | POA: Diagnosis not present

## 2020-05-30 DIAGNOSIS — Z9981 Dependence on supplemental oxygen: Secondary | ICD-10-CM | POA: Diagnosis not present

## 2020-05-30 DIAGNOSIS — G4733 Obstructive sleep apnea (adult) (pediatric): Secondary | ICD-10-CM | POA: Diagnosis not present

## 2020-05-30 DIAGNOSIS — R1310 Dysphagia, unspecified: Secondary | ICD-10-CM | POA: Diagnosis not present

## 2020-05-31 DIAGNOSIS — R1313 Dysphagia, pharyngeal phase: Secondary | ICD-10-CM | POA: Diagnosis not present

## 2020-05-31 DIAGNOSIS — R1310 Dysphagia, unspecified: Secondary | ICD-10-CM | POA: Diagnosis not present

## 2020-05-31 DIAGNOSIS — J1282 Pneumonia due to coronavirus disease 2019: Secondary | ICD-10-CM | POA: Diagnosis not present

## 2020-05-31 DIAGNOSIS — Z7982 Long term (current) use of aspirin: Secondary | ICD-10-CM | POA: Diagnosis not present

## 2020-05-31 DIAGNOSIS — G4733 Obstructive sleep apnea (adult) (pediatric): Secondary | ICD-10-CM | POA: Diagnosis not present

## 2020-05-31 DIAGNOSIS — Z9981 Dependence on supplemental oxygen: Secondary | ICD-10-CM | POA: Diagnosis not present

## 2020-05-31 DIAGNOSIS — D649 Anemia, unspecified: Secondary | ICD-10-CM | POA: Diagnosis not present

## 2020-05-31 DIAGNOSIS — U071 COVID-19: Secondary | ICD-10-CM | POA: Diagnosis not present

## 2020-05-31 DIAGNOSIS — Z930 Tracheostomy status: Secondary | ICD-10-CM | POA: Diagnosis not present

## 2020-06-05 DIAGNOSIS — Z7982 Long term (current) use of aspirin: Secondary | ICD-10-CM | POA: Diagnosis not present

## 2020-06-05 DIAGNOSIS — R1313 Dysphagia, pharyngeal phase: Secondary | ICD-10-CM | POA: Diagnosis not present

## 2020-06-05 DIAGNOSIS — J1282 Pneumonia due to coronavirus disease 2019: Secondary | ICD-10-CM | POA: Diagnosis not present

## 2020-06-05 DIAGNOSIS — D649 Anemia, unspecified: Secondary | ICD-10-CM | POA: Diagnosis not present

## 2020-06-05 DIAGNOSIS — Z9981 Dependence on supplemental oxygen: Secondary | ICD-10-CM | POA: Diagnosis not present

## 2020-06-05 DIAGNOSIS — Z930 Tracheostomy status: Secondary | ICD-10-CM | POA: Diagnosis not present

## 2020-06-05 DIAGNOSIS — G4733 Obstructive sleep apnea (adult) (pediatric): Secondary | ICD-10-CM | POA: Diagnosis not present

## 2020-06-05 DIAGNOSIS — U071 COVID-19: Secondary | ICD-10-CM | POA: Diagnosis not present

## 2020-06-05 DIAGNOSIS — R1310 Dysphagia, unspecified: Secondary | ICD-10-CM | POA: Diagnosis not present

## 2020-06-08 DIAGNOSIS — Z930 Tracheostomy status: Secondary | ICD-10-CM | POA: Diagnosis not present

## 2020-06-08 DIAGNOSIS — D649 Anemia, unspecified: Secondary | ICD-10-CM | POA: Diagnosis not present

## 2020-06-08 DIAGNOSIS — G4733 Obstructive sleep apnea (adult) (pediatric): Secondary | ICD-10-CM | POA: Diagnosis not present

## 2020-06-08 DIAGNOSIS — U071 COVID-19: Secondary | ICD-10-CM | POA: Diagnosis not present

## 2020-06-08 DIAGNOSIS — R1310 Dysphagia, unspecified: Secondary | ICD-10-CM | POA: Diagnosis not present

## 2020-06-08 DIAGNOSIS — J1282 Pneumonia due to coronavirus disease 2019: Secondary | ICD-10-CM | POA: Diagnosis not present

## 2020-06-08 DIAGNOSIS — R1313 Dysphagia, pharyngeal phase: Secondary | ICD-10-CM | POA: Diagnosis not present

## 2020-06-08 DIAGNOSIS — Z9981 Dependence on supplemental oxygen: Secondary | ICD-10-CM | POA: Diagnosis not present

## 2020-06-08 DIAGNOSIS — Z7982 Long term (current) use of aspirin: Secondary | ICD-10-CM | POA: Diagnosis not present

## 2020-06-11 DIAGNOSIS — J9620 Acute and chronic respiratory failure, unspecified whether with hypoxia or hypercapnia: Secondary | ICD-10-CM | POA: Diagnosis not present

## 2020-06-11 DIAGNOSIS — J849 Interstitial pulmonary disease, unspecified: Secondary | ICD-10-CM | POA: Diagnosis not present

## 2020-07-10 DIAGNOSIS — Z136 Encounter for screening for cardiovascular disorders: Secondary | ICD-10-CM | POA: Diagnosis not present

## 2020-07-10 DIAGNOSIS — U071 COVID-19: Secondary | ICD-10-CM | POA: Diagnosis not present

## 2020-07-10 DIAGNOSIS — Z125 Encounter for screening for malignant neoplasm of prostate: Secondary | ICD-10-CM | POA: Diagnosis not present

## 2020-07-10 DIAGNOSIS — E559 Vitamin D deficiency, unspecified: Secondary | ICD-10-CM | POA: Diagnosis not present

## 2020-07-10 DIAGNOSIS — Z131 Encounter for screening for diabetes mellitus: Secondary | ICD-10-CM | POA: Diagnosis not present

## 2020-07-10 DIAGNOSIS — L89152 Pressure ulcer of sacral region, stage 2: Secondary | ICD-10-CM | POA: Diagnosis not present

## 2020-07-10 DIAGNOSIS — G4733 Obstructive sleep apnea (adult) (pediatric): Secondary | ICD-10-CM | POA: Diagnosis not present

## 2020-07-10 DIAGNOSIS — Z0001 Encounter for general adult medical examination with abnormal findings: Secondary | ICD-10-CM | POA: Diagnosis not present

## 2020-07-10 DIAGNOSIS — J1282 Pneumonia due to coronavirus disease 2019: Secondary | ICD-10-CM | POA: Diagnosis not present

## 2020-07-12 DIAGNOSIS — J849 Interstitial pulmonary disease, unspecified: Secondary | ICD-10-CM | POA: Diagnosis not present

## 2020-08-09 DIAGNOSIS — J849 Interstitial pulmonary disease, unspecified: Secondary | ICD-10-CM | POA: Diagnosis not present

## 2020-08-13 DIAGNOSIS — R7303 Prediabetes: Secondary | ICD-10-CM | POA: Diagnosis not present

## 2020-08-13 DIAGNOSIS — E782 Mixed hyperlipidemia: Secondary | ICD-10-CM | POA: Diagnosis not present

## 2020-08-13 DIAGNOSIS — J1282 Pneumonia due to coronavirus disease 2019: Secondary | ICD-10-CM | POA: Diagnosis not present

## 2020-08-13 DIAGNOSIS — G4733 Obstructive sleep apnea (adult) (pediatric): Secondary | ICD-10-CM | POA: Diagnosis not present

## 2021-04-01 DIAGNOSIS — R7303 Prediabetes: Secondary | ICD-10-CM | POA: Diagnosis not present

## 2021-04-01 DIAGNOSIS — U071 COVID-19: Secondary | ICD-10-CM | POA: Diagnosis not present

## 2021-04-01 DIAGNOSIS — E782 Mixed hyperlipidemia: Secondary | ICD-10-CM | POA: Diagnosis not present

## 2021-04-01 DIAGNOSIS — Z2821 Immunization not carried out because of patient refusal: Secondary | ICD-10-CM | POA: Diagnosis not present

## 2021-04-01 DIAGNOSIS — Z125 Encounter for screening for malignant neoplasm of prostate: Secondary | ICD-10-CM | POA: Diagnosis not present

## 2021-04-01 DIAGNOSIS — Z0001 Encounter for general adult medical examination with abnormal findings: Secondary | ICD-10-CM | POA: Diagnosis not present

## 2021-04-01 DIAGNOSIS — Z131 Encounter for screening for diabetes mellitus: Secondary | ICD-10-CM | POA: Diagnosis not present

## 2021-04-01 DIAGNOSIS — J8410 Pulmonary fibrosis, unspecified: Secondary | ICD-10-CM | POA: Diagnosis not present

## 2021-04-01 DIAGNOSIS — Z136 Encounter for screening for cardiovascular disorders: Secondary | ICD-10-CM | POA: Diagnosis not present

## 2021-04-01 DIAGNOSIS — E559 Vitamin D deficiency, unspecified: Secondary | ICD-10-CM | POA: Diagnosis not present

## 2021-04-01 DIAGNOSIS — J1282 Pneumonia due to coronavirus disease 2019: Secondary | ICD-10-CM | POA: Diagnosis not present

## 2021-04-22 DIAGNOSIS — Z2821 Immunization not carried out because of patient refusal: Secondary | ICD-10-CM | POA: Diagnosis not present

## 2021-04-22 DIAGNOSIS — E782 Mixed hyperlipidemia: Secondary | ICD-10-CM | POA: Diagnosis not present

## 2021-04-22 DIAGNOSIS — J8410 Pulmonary fibrosis, unspecified: Secondary | ICD-10-CM | POA: Diagnosis not present

## 2021-09-23 DIAGNOSIS — K4090 Unilateral inguinal hernia, without obstruction or gangrene, not specified as recurrent: Secondary | ICD-10-CM

## 2021-09-23 HISTORY — DX: Unilateral inguinal hernia, without obstruction or gangrene, not specified as recurrent: K40.90

## 2021-10-15 ENCOUNTER — Emergency Department (HOSPITAL_BASED_OUTPATIENT_CLINIC_OR_DEPARTMENT_OTHER)
Admission: EM | Admit: 2021-10-15 | Discharge: 2021-10-15 | Disposition: A | Discharge status: Home / Self Care | Payer: BC Managed Care – PPO | Attending: Emergency Medicine | Admitting: Emergency Medicine

## 2021-10-15 ENCOUNTER — Other Ambulatory Visit: Payer: Self-pay

## 2021-10-15 ENCOUNTER — Emergency Department (HOSPITAL_BASED_OUTPATIENT_CLINIC_OR_DEPARTMENT_OTHER): Payer: BC Managed Care – PPO

## 2021-10-15 ENCOUNTER — Encounter (HOSPITAL_BASED_OUTPATIENT_CLINIC_OR_DEPARTMENT_OTHER): Payer: Self-pay

## 2021-10-15 DIAGNOSIS — K5792 Diverticulitis of intestine, part unspecified, without perforation or abscess without bleeding: Secondary | ICD-10-CM | POA: Diagnosis not present

## 2021-10-15 DIAGNOSIS — N200 Calculus of kidney: Secondary | ICD-10-CM | POA: Diagnosis not present

## 2021-10-15 DIAGNOSIS — K4090 Unilateral inguinal hernia, without obstruction or gangrene, not specified as recurrent: Secondary | ICD-10-CM | POA: Diagnosis not present

## 2021-10-15 DIAGNOSIS — K5730 Diverticulosis of large intestine without perforation or abscess without bleeding: Secondary | ICD-10-CM | POA: Diagnosis not present

## 2021-10-15 DIAGNOSIS — R9431 Abnormal electrocardiogram [ECG] [EKG]: Secondary | ICD-10-CM | POA: Diagnosis not present

## 2021-10-15 DIAGNOSIS — K5732 Diverticulitis of large intestine without perforation or abscess without bleeding: Secondary | ICD-10-CM | POA: Diagnosis not present

## 2021-10-15 DIAGNOSIS — R1030 Lower abdominal pain, unspecified: Secondary | ICD-10-CM | POA: Diagnosis not present

## 2021-10-15 DIAGNOSIS — N281 Cyst of kidney, acquired: Secondary | ICD-10-CM | POA: Diagnosis not present

## 2021-10-15 HISTORY — DX: COVID-19: U07.1

## 2021-10-15 LAB — COMPREHENSIVE METABOLIC PANEL
ALT: 33 U/L (ref 0–44)
AST: 22 U/L (ref 15–41)
Albumin: 4.1 g/dL (ref 3.5–5.0)
Alkaline Phosphatase: 78 U/L (ref 38–126)
Anion gap: 6 (ref 5–15)
BUN: 13 mg/dL (ref 8–23)
CO2: 24 mmol/L (ref 22–32)
Calcium: 8.8 mg/dL — ABNORMAL LOW (ref 8.9–10.3)
Chloride: 106 mmol/L (ref 98–111)
Creatinine, Ser: 0.91 mg/dL (ref 0.61–1.24)
GFR, Estimated: >60 mL/min (ref >60)
Glucose, Bld: 144 mg/dL — ABNORMAL HIGH (ref 70–99)
Potassium: 3.6 mmol/L (ref 3.5–5.1)
Sodium: 136 mmol/L (ref 135–145)
Total Bilirubin: 0.9 mg/dL (ref 0.3–1.2)
Total Protein: 7.1 g/dL (ref 6.5–8.1)

## 2021-10-15 LAB — CBC WITH DIFFERENTIAL/PLATELET
Abs Immature Granulocytes: 0.03 10*3/uL (ref 0.00–0.07)
Basophils Absolute: 0.1 10*3/uL (ref 0.0–0.1)
Basophils Relative: 1 %
Eosinophils Absolute: 0.1 10*3/uL (ref 0.0–0.5)
Eosinophils Relative: 2 %
HCT: 44.6 % (ref 39.0–52.0)
Hemoglobin: 15.8 g/dL (ref 13.0–17.0)
Immature Granulocytes: 0 %
Lymphocytes Relative: 19 %
Lymphs Abs: 1.4 10*3/uL (ref 0.7–4.0)
MCH: 29.2 pg (ref 26.0–34.0)
MCHC: 35.4 g/dL (ref 30.0–36.0)
MCV: 82.3 fL (ref 80.0–100.0)
Monocytes Absolute: 0.6 10*3/uL (ref 0.1–1.0)
Monocytes Relative: 8 %
Neutro Abs: 5 10*3/uL (ref 1.7–7.7)
Neutrophils Relative %: 70 %
Platelets: 271 10*3/uL (ref 150–400)
RBC: 5.42 MIL/uL (ref 4.22–5.81)
RDW: 13.3 % (ref 11.5–15.5)
WBC: 7.2 10*3/uL (ref 4.0–10.5)
nRBC: 0 % (ref 0.0–0.2)

## 2021-10-15 LAB — LIPASE, BLOOD: Lipase: 45 U/L (ref 11–51)

## 2021-10-15 MED ORDER — ONDANSETRON 4 MG PO TBDP: 4.0000 mg | ORAL_TABLET | Freq: Three times a day (TID) | ORAL | 0 refills | Status: DC | PRN

## 2021-10-15 MED ORDER — IOHEXOL 300 MG/ML  SOLN
100.0000 mL | Freq: Once | INTRAMUSCULAR | Status: AC | PRN
  Administered 2021-10-15: 100 mL via INTRAVENOUS

## 2021-10-15 MED ORDER — AMOXICILLIN-POT CLAVULANATE 875-125 MG PO TABS: 1.0000 | ORAL_TABLET | Freq: Two times a day (BID) | ORAL | 0 refills | Status: DC

## 2021-10-15 NOTE — ED Triage Notes (Signed)
Pt reports lower abdomen pain since Friday night. States he massaged his abdomen and was able to have a BM. Also reports left upper leg pain for over a week. Loose stools x 3 on Sunday. No BM since then. No vomiting . Feels weak and concerned about 5 lb weight loss

## 2021-10-15 NOTE — ED Provider Notes (Signed)
[facility] EMERGENCY DEPARTMENT Provider Note   CSN: [id number] Arrival date & time: 10/15/21  0840     History  Chief Complaint  Patient presents with   Abdominal Pain    John Wong is a 66 y.o. male.  Presented to the emergency department due to concern for abdominal pain.  Patient states he has been having lower abdominal pain since Friday evening.  Sunday states that he felt very constipated but then had a couple of loose stools.  No blood in stool.  No stool since then.  Some nausea but no vomiting.  Also has noted few pound weight loss recently.  Unintentional.  Reports that he has a hernia in his left groin.  Denies prior history of SBO.  HPI     Home Medications Prior to Admission medications   Medication Sig Start Date End Date Taking? Authorizing Provider  amoxicillin-clavulanate (AUGMENTIN) 875-125 MG tablet Take 1 tablet by mouth every 12 (twelve) hours. 10/15/21  Yes Milagros Loll, MD  ondansetron (ZOFRAN-ODT) 4 MG disintegrating tablet Take 1 tablet (4 mg total) by mouth every 8 (eight) hours as needed for nausea or vomiting. 10/15/21  Yes Milagros Loll, MD      Allergies    Patient has no known allergies.    Review of Systems   Review of Systems  Constitutional:  Positive for unexpected weight change. Negative for chills and fever.  HENT:  Negative for ear pain and sore throat.   Eyes:  Negative for pain and visual disturbance.  Respiratory:  Negative for cough and shortness of breath.   Cardiovascular:  Negative for chest pain and palpitations.  Gastrointestinal:  Positive for abdominal pain and nausea. Negative for vomiting.  Genitourinary:  Negative for dysuria and hematuria.  Musculoskeletal:  Negative for arthralgias and back pain.  Skin:  Negative for color change and rash.  Neurological:  Negative for seizures and syncope.  All other systems reviewed and are negative.  Physical Exam Updated Vital Signs BP 111/75 (BP Location:  Right Arm)   Pulse 78   Temp 98.7 F (37.1 C) (Oral)   Resp 18   Ht 5\' 10"  (1.778 m)   Wt 81.6 kg   SpO2 95%   BMI 25.83 kg/m  Physical Exam Vitals and nursing note reviewed.  Constitutional:      General: He is not in acute distress.    Appearance: He is well-developed.  HENT:     Head: Normocephalic and atraumatic.  Eyes:     Conjunctiva/sclera: Conjunctivae normal.  Cardiovascular:     Rate and Rhythm: Normal rate and regular rhythm.     Heart sounds: No murmur heard. Pulmonary:     Effort: Pulmonary effort is normal. No respiratory distress.     Breath sounds: Normal breath sounds.  Abdominal:     Palpations: Abdomen is soft.     Tenderness: There is generalized abdominal tenderness. There is no guarding or rebound.  Musculoskeletal:        General: No swelling.     Cervical back: Neck supple.  Skin:    General: Skin is warm and dry.     Capillary Refill: Capillary refill takes less than 2 seconds.  Neurological:     General: No focal deficit present.     Mental Status: He is alert.  Psychiatric:        Mood and Affect: Mood normal.    ED Results / Procedures / Treatments   Labs (all labs ordered  are listed, but only abnormal results are displayed) Labs Reviewed  COMPREHENSIVE METABOLIC PANEL - Abnormal; Notable for the following components:      Result Value   Glucose, Bld 144 (*)    Calcium 8.8 (*)    All other components within normal limits  CBC WITH DIFFERENTIAL/PLATELET  LIPASE, BLOOD    EKG EKG Interpretation  Date/Time:  Tuesday Oct 15 2021 08:53:51 EDT Ventricular Rate:  99 PR Interval:  187 QRS Duration: 86 QT Interval:  349 QTC Calculation: 448 R Axis:   15 Text Interpretation: Sinus rhythm Abnormal R-wave progression, early transition Confirmed by Marianna Fuss ([id number]) on 10/15/2021 9:41:21 AM  Radiology CT ABDOMEN PELVIS W CONTRAST  Result Date: 10/15/2021 CLINICAL DATA:  Abdominal pain, acute, nonlocalized. Lower abdominal pain  over the last 3 days. EXAM: CT ABDOMEN AND PELVIS WITH CONTRAST TECHNIQUE: Multidetector CT imaging of the abdomen and pelvis was performed using the standard protocol following bolus administration of intravenous contrast. RADIATION DOSE REDUCTION: This exam was performed according to the departmental dose-optimization program which includes automated exposure control, adjustment of the mA and/or kV according to patient size and/or use of iterative reconstruction technique. CONTRAST:  OMNIPAQUE IOHEXOL 300 MG/ML  SOLN COMPARISON:  None FINDINGS: Lower chest: Mild scarring at the lung bases. No pleural effusion. Relative elevation of the left hemidiaphragm. Hepatobiliary: Liver parenchyma is normal.  No calcified gallstones. Pancreas: Normal Spleen: Normal Adrenals/Urinary Tract: Adrenal glands are normal. Right kidney contains a nonobstructing 2 mm stone in the lower pole. Simple appearing cyst in the lower pole measuring 2.6 cm in diameter. Left kidney is normal. Bladder is normal. Stomach/Bowel: Stomach and small intestine are normal. Normal appendix. Moderate amount of fecal matter within the right colon and transverse colon. No sign of diverticulitis. Diverticulosis of the left colon with mild diverticulitis of the proximal sigmoid region. No abscess, free fluid or free air. Vascular/Lymphatic: Aortic atherosclerosis. No aneurysm. IVC is normal. No adenopathy. Reproductive: Normal Other: Bilateral inguinal hernias containing only fat. Musculoskeletal: Ordinary lower lumbar degenerative changes. IMPRESSION: Acute diverticulitis of the proximal sigmoid colon, mild in degree. No evidence of abscess, free fluid or free air. Bilateral inguinal hernias containing only fat. Aortic atherosclerosis. 2 mm nonobstructing stone lower pole right kidney. Electronically Signed   By: Paulina Fusi M.D.   On: 10/15/2021 10:15    Procedures Procedures    Medications Ordered in ED Medications  iohexol (OMNIPAQUE) 300  MG/ML solution 100 mL (100 mLs Intravenous Contrast Given 10/15/21 0957)    ED Course/ Medical Decision Making/ A&P                           Medical Decision Making Amount and/or Complexity of Data Reviewed Labs: ordered. Radiology: ordered.  Risk Prescription drug management.   66 year old gentleman presenting to the ER due to concern for generalized abdominal discomfort, lower abdominal pain.  On exam well-appearing in no distress with normal vital signs.  Generalized tenderness noted.  Will check basic labs, CT. independently reviewed and interpreted CT images.  Agree with radiology report.  Findings concerning for uncomplicated diverticulitis.  Updated patient, symptoms well controlled, afebrile, no leukocytosis, do not feel patient requires admission at this time and stable for discharge home.  Reviewed return precautions, advised follow-up with PCP.  Given Rx for Augmentin. Updated family - wife and daughter at bedside.     After the discussed management above, the patient was determined to be safe for  discharge.  The patient was in agreement with this plan and all questions regarding their care were answered.  ED return precautions were discussed and the patient will return to the ED with any significant worsening of condition.         Final Clinical Impression(s) / ED Diagnoses Final diagnoses:  Diverticulitis    Rx / DC Orders ED Discharge Orders          Ordered    amoxicillin-clavulanate (AUGMENTIN) 875-125 MG tablet  Every 12 hours        10/15/21 1049    ondansetron (ZOFRAN-ODT) 4 MG disintegrating tablet  Every 8 hours PRN        10/15/21 1051              Milagros Lollykstra, Dagmawi Venable S, MD 10/15/21 1316

## 2021-10-15 NOTE — Discharge Instructions (Signed)
Please follow-up with your primary care doctor.  Take the antibiotic as prescribed for the diverticulitis.  Can take the Zofran as needed for nausea.  If you are having vomiting, fever, worsening abdominal pain, come back to ER for reassessment.

## 2021-10-22 ENCOUNTER — Ambulatory Visit (INDEPENDENT_AMBULATORY_CARE_PROVIDER_SITE_OTHER): Payer: BC Managed Care – PPO | Admitting: Surgery

## 2021-10-22 ENCOUNTER — Encounter: Payer: Self-pay | Admitting: Surgery

## 2021-10-22 ENCOUNTER — Other Ambulatory Visit: Payer: Self-pay

## 2021-10-22 ENCOUNTER — Telehealth: Payer: Self-pay | Admitting: Surgery

## 2021-10-22 ENCOUNTER — Ambulatory Visit: Payer: Self-pay | Admitting: Surgery

## 2021-10-22 VITALS — BP 124/83 | HR 76 | Temp 98.3°F | Ht 70.0 in | Wt 189.4 lb

## 2021-10-22 DIAGNOSIS — K4030 Unilateral inguinal hernia, with obstruction, without gangrene, not specified as recurrent: Secondary | ICD-10-CM | POA: Diagnosis not present

## 2021-10-22 DIAGNOSIS — K4091 Unilateral inguinal hernia, without obstruction or gangrene, recurrent: Secondary | ICD-10-CM

## 2021-10-22 DIAGNOSIS — D682 Hereditary deficiency of other clotting factors: Secondary | ICD-10-CM | POA: Insufficient documentation

## 2021-10-22 DIAGNOSIS — Z860100 Personal history of colon polyps, unspecified: Secondary | ICD-10-CM | POA: Insufficient documentation

## 2021-10-22 DIAGNOSIS — K4090 Unilateral inguinal hernia, without obstruction or gangrene, not specified as recurrent: Secondary | ICD-10-CM | POA: Insufficient documentation

## 2021-10-22 DIAGNOSIS — K5732 Diverticulitis of large intestine without perforation or abscess without bleeding: Secondary | ICD-10-CM

## 2021-10-22 NOTE — Progress Notes (Signed)
Patient ID: John Wong, male   DOB: 02/16/1956, 66 y.o.   MRN: [id number]  Chief Complaint: Left inguinal hernia  History of Present Illness John Wong is a 66 y.o. male with prior history of left inguinal hernia approximately 20 years ago, no mesh was utilized.  Suspected likely recurred within 5 years, or 5 years ago.  But has been essentially asymptomatic until the last 2 months when it became more prominent and symptomatic with discomfort.  Exacerbating features have been lifting, gardening, coughing or sneezing.  A week ago was seen in the ED and treated as an outpatient for diverticulitis as confirmed on CT scan.  He has done well with oral antibiotics and is currently pain-free having daily bowel movements.  We also discussed bowel habits, fiber supplementation and the need for follow-up colonoscopy having had a polyp removed about 5 years ago. Nevertheless he wishes to proceed on with hernia repair. We did note that the CT scan did also show a right-sided fat filled inguinal hernia.  Past Medical History Past Medical History:  Diagnosis Date   COVID       Past Surgical History:  Procedure Laterality Date   ANKLE SURGERY     HERNIA REPAIR     KNEE SURGERY     TRACHELECTOMY      No Known Allergies  Current Outpatient Medications  Medication Sig Dispense Refill   aspirin EC 81 MG tablet Take 81 mg by mouth daily. Swallow whole.     No current facility-administered medications for this visit.    Family History History reviewed. No pertinent family history.    Social History Social History   Tobacco Use   Smoking status: Never   Smokeless tobacco: Never  Substance Use Topics   Alcohol use: Never   Drug use: Never       Review of Systems  Constitutional: Negative.   HENT: Negative.    Eyes: Negative.   Respiratory: Negative.    Cardiovascular:  Positive for palpitations.  Gastrointestinal:  Positive for abdominal pain (Recent diagnosis of sigmoid  diverticulitis, uncomplicated.).  Genitourinary: Negative.   Skin: Negative.   Neurological:  Positive for dizziness (Benign positional vertigo.) and sensory change (Nerve pain left leg.).  Psychiatric/Behavioral: Negative.       Physical Exam Blood pressure 124/83, pulse 76, temperature 98.3 F (36.8 C), temperature source Oral, height 5' 10" (1.778 m), weight 189 lb 6.4 oz (85.9 kg), SpO2 99 %. Last Weight  Most recent update: 10/22/2021 10:18 AM    Weight  85.9 kg (189 lb 6.4 oz)             CONSTITUTIONAL: Well developed, and nourished, appropriately responsive and aware without distress.   EYES: Sclera non-icteric.   EARS, NOSE, MOUTH AND THROAT: The oropharynx is clear. Oral mucosa is pink and moist.  Hearing is intact to voice.  NECK: Trachea is midline, and there is no jugular venous distension.  LYMPH NODES:  Lymph nodes in the neck are not enlarged. RESPIRATORY:  Lungs are clear, and breath sounds are equal bilaterally. Normal respiratory effort without pathologic use of accessory muscles. CARDIOVASCULAR: Heart is regular in rate and rhythm. GI: The abdomen is soft, nontender, and nondistended.  There is a palpable stool mass in the right colon, there were no other palpable masses. I did not appreciate hepatosplenomegaly. There were normal bowel sounds. GU: Bilateral inguinal hernias on exam.  Left greater than right, left groin scar noted.  Both reducible. MUSCULOSKELETAL:  Symmetrical muscle   tone appreciated in all four extremities.    SKIN: Skin turgor is normal. No pathologic skin lesions appreciated.  NEUROLOGIC:  Motor and sensation appear grossly normal.  Cranial nerves are grossly without defect. PSYCH:  Alert and oriented to person, place and time. Affect is appropriate for situation.  Data Reviewed I have personally reviewed what is currently available of the patient's imaging, recent labs and medical records.   Labs:     Latest Ref Rng & Units 10/15/2021     9:06 AM  CBC  WBC 4.0 - 10.5 K/uL 7.2    Hemoglobin 13.0 - 17.0 g/dL 15.8    Hematocrit 39.0 - 52.0 % 44.6    Platelets 150 - 400 K/uL 271        Latest Ref Rng & Units 10/15/2021    9:06 AM  CMP  Glucose 70 - 99 mg/dL 144    BUN 8 - 23 mg/dL 13    Creatinine 0.61 - 1.24 mg/dL 0.91    Sodium 135 - 145 mmol/L 136    Potassium 3.5 - 5.1 mmol/L 3.6    Chloride 98 - 111 mmol/L 106    CO2 22 - 32 mmol/L 24    Calcium 8.9 - 10.3 mg/dL 8.8    Total Protein 6.5 - 8.1 g/dL 7.1    Total Bilirubin 0.3 - 1.2 mg/dL 0.9    Alkaline Phos 38 - 126 U/L 78    AST 15 - 41 U/L 22    ALT 0 - 44 U/L 33      Imaging: Radiology review:  CLINICAL DATA:  Abdominal pain, acute, nonlocalized. Lower abdominal pain over the last 3 days.   EXAM: CT ABDOMEN AND PELVIS WITH CONTRAST   TECHNIQUE: Multidetector CT imaging of the abdomen and pelvis was performed using the standard protocol following bolus administration of intravenous contrast.   RADIATION DOSE REDUCTION: This exam was performed according to the departmental dose-optimization program which includes automated exposure control, adjustment of the mA and/or kV according to patient size and/or use of iterative reconstruction technique.   CONTRAST:  100mL OMNIPAQUE IOHEXOL 300 MG/ML  SOLN   COMPARISON:  None   FINDINGS: Lower chest: Mild scarring at the lung bases. No pleural effusion. Relative elevation of the left hemidiaphragm.   Hepatobiliary: Liver parenchyma is normal.  No calcified gallstones.   Pancreas: Normal   Spleen: Normal   Adrenals/Urinary Tract: Adrenal glands are normal. Right kidney contains a nonobstructing 2 mm stone in the lower pole. Simple appearing cyst in the lower pole measuring 2.6 cm in diameter. Left kidney is normal. Bladder is normal.   Stomach/Bowel: Stomach and small intestine are normal. Normal appendix. Moderate amount of fecal matter within the right colon and transverse colon. No sign of  diverticulitis. Diverticulosis of the left colon with mild diverticulitis of the proximal sigmoid region. No abscess, free fluid or free air.   Vascular/Lymphatic: Aortic atherosclerosis. No aneurysm. IVC is normal. No adenopathy.   Reproductive: Normal   Other: Bilateral inguinal hernias containing only fat.   Musculoskeletal: Ordinary lower lumbar degenerative changes.   IMPRESSION: Acute diverticulitis of the proximal sigmoid colon, mild in degree. No evidence of abscess, free fluid or free air.   Bilateral inguinal hernias containing only fat.   Aortic atherosclerosis.   2 mm nonobstructing stone lower pole right kidney.     Electronically Signed   By: Mark  Shogry M.D.   On: 10/15/2021 10:15 Within last 24 hrs: No results found.    Assessment    Recurrent left inguinal hernia, fat filled right inguinal hernia. Recent diagnosis of diverticulitis, resolving with antibiotics. Need for follow-up colonoscopy. 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  And though other issues are current, he would like to proceed with getting the left groin issue resolved. We will plan on robotic recurrent left inguinal hernia repair, possible bilateral. I discussed possibility of incarceration, strangulation, enlargement in size over time, and the need for emergency surgery in the face of these.  Also reviewed the  techniques of reduction should incarceration occur, and when unsuccessful to present to the ED.  Also discussed that surgery risks include recurrence which can be up to 30% in the case of complex hernias, use of prosthetic materials (mesh) and the increased risk of infection and the possible need for re-operation and removal of mesh, possibility of post-op SBO or ileus, and the risks of general anesthetic including heart attack, stroke, sudden death or some reaction to anesthetic medications. The patient, and those present, appear to understand the risks, any and all questions were answered to the patient's satisfaction.  No guarantees were ever expressed or implied.  We also discussed the role of supplemental fiber in his diet, pursuing regular bowel activity.  Emphasizing the importance of more frequent smaller bowel movements.  Face-to-face time spent with the patient and accompanying care providers(if present) was 45 minutes, with more than 50% of the time spent counseling, educating, and coordinating care of the patient.    These notes generated with voice recognition software. I apologize for typographical errors.  Trevonte Ashkar M.D., FACS 10/22/2021, 11:01 AM     

## 2021-10-22 NOTE — H&P (View-Only) (Signed)
Patient ID: John Wong, male   DOB: 1955-07-22, 66 y.o.   MRN: [id number]  Chief Complaint: Left inguinal hernia  History of Present Illness John Wong is a 66 y.o. male with prior history of left inguinal hernia approximately 20 years ago, no mesh was utilized.  Suspected likely recurred within 5 years, or 5 years ago.  But has been essentially asymptomatic until the last 2 months when it became more prominent and symptomatic with discomfort.  Exacerbating features have been lifting, gardening, coughing or sneezing.  A week ago was seen in the ED and treated as an outpatient for diverticulitis as confirmed on CT scan.  He has done well with oral antibiotics and is currently pain-free having daily bowel movements.  We also discussed bowel habits, fiber supplementation and the need for follow-up colonoscopy having had a polyp removed about 5 years ago. Nevertheless he wishes to proceed on with hernia repair. We did note that the CT scan did also show a right-sided fat filled inguinal hernia.  Past Medical History Past Medical History:  Diagnosis Date   COVID       Past Surgical History:  Procedure Laterality Date   ANKLE SURGERY     HERNIA REPAIR     KNEE SURGERY     TRACHELECTOMY      No Known Allergies  Current Outpatient Medications  Medication Sig Dispense Refill   aspirin EC 81 MG tablet Take 81 mg by mouth daily. Swallow whole.     No current facility-administered medications for this visit.    Family History History reviewed. No pertinent family history.    Social History Social History   Tobacco Use   Smoking status: Never   Smokeless tobacco: Never  Substance Use Topics   Alcohol use: Never   Drug use: Never       Review of Systems  Constitutional: Negative.   HENT: Negative.    Eyes: Negative.   Respiratory: Negative.    Cardiovascular:  Positive for palpitations.  Gastrointestinal:  Positive for abdominal pain (Recent diagnosis of sigmoid  diverticulitis, uncomplicated.).  Genitourinary: Negative.   Skin: Negative.   Neurological:  Positive for dizziness (Benign positional vertigo.) and sensory change (Nerve pain left leg.).  Psychiatric/Behavioral: Negative.       Physical Exam Blood pressure 124/83, pulse 76, temperature 98.3 F (36.8 C), temperature source Oral, height 5\' 10"  (1.778 m), weight 189 lb 6.4 oz (85.9 kg), SpO2 99 %. Last Weight  Most recent update: 10/22/2021 10:18 AM    Weight  85.9 kg (189 lb 6.4 oz)             CONSTITUTIONAL: Well developed, and nourished, appropriately responsive and aware without distress.   EYES: Sclera non-icteric.   EARS, NOSE, MOUTH AND THROAT: The oropharynx is clear. Oral mucosa is pink and moist.  Hearing is intact to voice.  NECK: Trachea is midline, and there is no jugular venous distension.  LYMPH NODES:  Lymph nodes in the neck are not enlarged. RESPIRATORY:  Lungs are clear, and breath sounds are equal bilaterally. Normal respiratory effort without pathologic use of accessory muscles. CARDIOVASCULAR: Heart is regular in rate and rhythm. GI: The abdomen is soft, nontender, and nondistended.  There is a palpable stool mass in the right colon, there were no other palpable masses. I did not appreciate hepatosplenomegaly. There were normal bowel sounds. GU: Bilateral inguinal hernias on exam.  Left greater than right, left groin scar noted.  Both reducible. MUSCULOSKELETAL:  Symmetrical muscle  tone appreciated in all four extremities.    SKIN: Skin turgor is normal. No pathologic skin lesions appreciated.  NEUROLOGIC:  Motor and sensation appear grossly normal.  Cranial nerves are grossly without defect. PSYCH:  Alert and oriented to person, place and time. Affect is appropriate for situation.  Data Reviewed I have personally reviewed what is currently available of the patient's imaging, recent labs and medical records.   Labs:     Latest Ref Rng & Units 10/15/2021     9:06 AM  CBC  WBC 4.0 - 10.5 K/uL 7.2    Hemoglobin 13.0 - 17.0 g/dL 15.8    Hematocrit 39.0 - 52.0 % 44.6    Platelets 150 - 400 K/uL 271        Latest Ref Rng & Units 10/15/2021    9:06 AM  CMP  Glucose 70 - 99 mg/dL 144    BUN 8 - 23 mg/dL 13    Creatinine 0.61 - 1.24 mg/dL 0.91    Sodium 135 - 145 mmol/L 136    Potassium 3.5 - 5.1 mmol/L 3.6    Chloride 98 - 111 mmol/L 106    CO2 22 - 32 mmol/L 24    Calcium 8.9 - 10.3 mg/dL 8.8    Total Protein 6.5 - 8.1 g/dL 7.1    Total Bilirubin 0.3 - 1.2 mg/dL 0.9    Alkaline Phos 38 - 126 U/L 78    AST 15 - 41 U/L 22    ALT 0 - 44 U/L 33      Imaging: Radiology review:  CLINICAL DATA:  Abdominal pain, acute, nonlocalized. Lower abdominal pain over the last 3 days.   EXAM: CT ABDOMEN AND PELVIS WITH CONTRAST   TECHNIQUE: Multidetector CT imaging of the abdomen and pelvis was performed using the standard protocol following bolus administration of intravenous contrast.   RADIATION DOSE REDUCTION: This exam was performed according to the departmental dose-optimization program which includes automated exposure control, adjustment of the mA and/or kV according to patient size and/or use of iterative reconstruction technique.   CONTRAST:  1104mL OMNIPAQUE IOHEXOL 300 MG/ML  SOLN   COMPARISON:  None   FINDINGS: Lower chest: Mild scarring at the lung bases. No pleural effusion. Relative elevation of the left hemidiaphragm.   Hepatobiliary: Liver parenchyma is normal.  No calcified gallstones.   Pancreas: Normal   Spleen: Normal   Adrenals/Urinary Tract: Adrenal glands are normal. Right kidney contains a nonobstructing 2 mm stone in the lower pole. Simple appearing cyst in the lower pole measuring 2.6 cm in diameter. Left kidney is normal. Bladder is normal.   Stomach/Bowel: Stomach and small intestine are normal. Normal appendix. Moderate amount of fecal matter within the right colon and transverse colon. No sign of  diverticulitis. Diverticulosis of the left colon with mild diverticulitis of the proximal sigmoid region. No abscess, free fluid or free air.   Vascular/Lymphatic: Aortic atherosclerosis. No aneurysm. IVC is normal. No adenopathy.   Reproductive: Normal   Other: Bilateral inguinal hernias containing only fat.   Musculoskeletal: Ordinary lower lumbar degenerative changes.   IMPRESSION: Acute diverticulitis of the proximal sigmoid colon, mild in degree. No evidence of abscess, free fluid or free air.   Bilateral inguinal hernias containing only fat.   Aortic atherosclerosis.   2 mm nonobstructing stone lower pole right kidney.     Electronically Signed   By: Nelson Chimes M.D.   On: 10/15/2021 10:15 Within last 24 hrs: No results found.  Assessment    Recurrent left inguinal hernia, fat filled right inguinal hernia. Recent diagnosis of diverticulitis, resolving with antibiotics. Need for follow-up colonoscopy. 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  And though other issues are current, he would like to proceed with getting the left groin issue resolved. We will plan on robotic recurrent left inguinal hernia repair, possible bilateral. I discussed possibility of incarceration, strangulation, enlargement in size over time, and the need for emergency surgery in the face of these.  Also reviewed the  techniques of reduction should incarceration occur, and when unsuccessful to present to the ED.  Also discussed that surgery risks include recurrence which can be up to 30% in the case of complex hernias, use of prosthetic materials (mesh) and the increased risk of infection and the possible need for re-operation and removal of mesh, possibility of post-op SBO or ileus, and the risks of general anesthetic including heart attack, stroke, sudden death or some reaction to anesthetic medications. The patient, and those present, appear to understand the risks, any and all questions were answered to the patient's satisfaction.  No guarantees were ever expressed or implied.  We also discussed the role of supplemental fiber in his diet, pursuing regular bowel activity.  Emphasizing the importance of more frequent smaller bowel movements.  Face-to-face time spent with the patient and accompanying care providers(if present) was 45 minutes, with more than 50% of the time spent counseling, educating, and coordinating care of the patient.    These notes generated with voice recognition software. I apologize for typographical errors.  Ronny Bacon M.D., FACS 10/22/2021, 11:01 AM

## 2021-10-22 NOTE — Patient Instructions (Addendum)
Our surgery scheduler will call you within 24-48 hours to schedule your surgery. Please have the Blue surgery sheet available when speaking with her.  Inguinal Hernia, Adult An inguinal hernia develops when fat or the intestines push through a weak spot in a muscle where the leg meets the lower abdomen (groin). This creates a bulge. This kind of hernia could also be: In the scrotum, if you are male. In folds of skin around the vagina, if you are male. There are three types of inguinal hernias: Hernias that can be pushed back into the abdomen (are reducible). This type rarely causes pain. Hernias that are not reducible (are incarcerated). Hernias that are not reducible and lose their blood supply (are strangulated). This type of hernia requires emergency surgery. What are the causes? This condition is caused by having a weak spot in the muscles or tissues in your groin. This develops over time. The hernia may poke through the weak spot when you suddenly strain your lower abdominal muscles, such as when you: Lift a heavy object. Strain to have a bowel movement. Constipation can lead to straining. Cough. What increases the risk? This condition is more likely to develop in: Males. Pregnant females. People who: Are overweight. Work in jobs that require long periods of standing or heavy lifting. Have had an inguinal hernia before. Smoke or have lung disease. These factors can lead to long-term (chronic) coughing. What are the signs or symptoms? Symptoms may depend on the size of the hernia. Often, a small inguinal hernia has no symptoms. Symptoms of a larger hernia may include: A bulge in the groin area. This is easier to see when standing. It might not be visible when lying down. Pain or burning in the groin. This may get worse when lifting, straining, or coughing. A dull ache or a feeling of pressure in the groin. An unusual bulge in the scrotum, in males. Symptoms of a strangulated  inguinal hernia may include: A bulge in your groin that is very painful and tender to the touch. A bulge that turns red or purple. Fever, nausea, and vomiting. Inability to have a bowel movement or to pass gas. How is this diagnosed? This condition is diagnosed based on your symptoms, your medical history, and a physical exam. Your health care provider may feel your groin area and ask you to cough. How is this treated? Treatment depends on the size of your hernia and whether you have symptoms. If you do not have symptoms, your health care provider may have you watch your hernia carefully and have you come in for follow-up visits. If your hernia is large or if you have symptoms, you may need surgery to repair the hernia. Follow these instructions at home: Lifestyle Avoid lifting heavy objects. Avoid standing for long periods of time. Do not use any products that contain nicotine or tobacco. These products include cigarettes, chewing tobacco, and vaping devices, such as e-cigarettes. If you need help quitting, ask your health care provider. Maintain a healthy weight. Preventing constipation You may need to take these actions to prevent or treat constipation: Drink enough fluid to keep your urine pale yellow. Take over-the-counter or prescription medicines. Eat foods that are high in fiber, such as beans, whole grains, and fresh fruits and vegetables. Limit foods that are high in fat and processed sugars, such as fried or sweet foods. General instructions You may try to push the hernia back in place by very gently pressing on it while lying down. Do   not try to force the bulge back in if it will not push in easily. Watch your hernia for any changes in shape, size, or color. Get help right away if you notice any changes. Take over-the-counter and prescription medicines only as told by your health care provider. Keep all follow-up visits. This is important. Contact a health care provider if: You  have a fever or chills. You develop new symptoms. Your symptoms get worse. Get help right away if: You have pain in your groin that suddenly gets worse. You have a bulge in your groin that: Suddenly gets bigger and does not get smaller. Becomes red or purple or painful to the touch. You are a man and you have a sudden pain in your scrotum, or the size of your scrotum suddenly changes. You cannot push the hernia back in place by very gently pressing on it when you are lying down. You have nausea or vomiting that does not go away. You have a fast heartbeat. You cannot have a bowel movement or pass gas. These symptoms may represent a serious problem that is an emergency. Do not wait to see if the symptoms will go away. Get medical help right away. Call your local emergency services (911 in the [place] Summary An inguinal hernia develops when fat or the intestines push through a weak spot in a muscle where your leg meets your lower abdomen (groin). This condition is caused by having a weak spot in muscles or tissues in your groin. Symptoms may depend on the size of the hernia, and they may include pain or swelling in your groin. A small inguinal hernia often has no symptoms. Treatment may not be needed if you do not have symptoms. If you have symptoms or a large hernia, you may need surgery to repair the hernia. Avoid lifting heavy objects. Also, avoid standing for long periods of time. This information is not intended to replace advice given to you by your health care provider. Make sure you discuss any questions you have with your health care provider. Document Revised: 01/10/2020 Document Reviewed: 01/10/2020 Elsevier Patient Education  2023 [employer]  

## 2021-10-22 NOTE — Telephone Encounter (Signed)
Patient and wife, have been advised of Pre-Admission date/time, COVID Testing date and Surgery date.  Surgery Date: 11/06/21 Preadmission Testing Date: 10/30/21 (phone 8a-1p) Covid Testing Date: Not needed.    Patient has been made aware to call [phone], between 1-3:00pm the day before surgery, to find out what time to arrive for surgery.

## 2021-10-28 DIAGNOSIS — M545 Low back pain, unspecified: Secondary | ICD-10-CM | POA: Diagnosis not present

## 2021-10-30 ENCOUNTER — Encounter
Admission: RE | Admit: 2021-10-30 | Discharge: 2021-10-30 | Disposition: A | Discharge status: Home / Self Care | Payer: BC Managed Care – PPO | Source: Ambulatory Visit | Attending: Surgery | Admitting: Surgery

## 2021-10-30 NOTE — Patient Instructions (Addendum)
Your procedure is scheduled on: Wednesday November 06, 2021. Report to Day Surgery inside [facility] 2nd floor, stop by admissions desk before getting on elevator. To find out your arrival time please call [phone] between 1PM - 3PM on Tuesday November 05, 2021.  Remember: Instructions that are not followed completely may result in serious medical risk,  up to and including death, or upon the discretion of your surgeon and anesthesiologist your  surgery may need to be rescheduled.     _X__ 1. Do not eat food or drink fluids after midnight the night before your procedure.                 No chewing gum or hard candies.   __X__2.  On the morning of surgery brush your teeth with toothpaste and water, you                may rinse your mouth with mouthwash if you wish.  Do not swallow any toothpaste or mouthwash.     _X__ 3.  No Alcohol for 24 hours before or after surgery.   _X__ 4.  Do Not Smoke or use e-cigarettes For 24 Hours Prior to Your Surgery.                 Do not use any chewable tobacco products for at least 6 hours prior to                 Surgery.  _X__  5.  Do not use any recreational drugs (marijuana, cocaine, heroin, ecstasy, MDMA or other)                For at least one week prior to your surgery.  Combination of these drugs with anesthesia                May have life threatening results.  ____  6.  Bring all medications with you on the day of surgery if instructed.   __X__  7.  Notify your doctor if there is any change in your medical condition      (cold, fever, infections).     Do not wear jewelry, make-up, hairpins, clips or nail polish. Do not wear lotions, powders, or perfumes. You may wear deodorant. Do not shave 48 hours prior to surgery. Men may shave face and neck. Do not bring valuables to the hospital.    [facility] is not responsible for any belongings or valuables.  Contacts, dentures or bridgework may not be worn into  surgery. Leave your suitcase in the car. After surgery it may be brought to your room. For patients admitted to the hospital, discharge time is determined by your treatment team.   Patients discharged the day of surgery will not be allowed to drive home.   Make arrangements for someone to be with you for the first 24 hours of your Same Day Discharge.   __X__ Take these medicines the morning of surgery with A SIP OF WATER:    1. None   2.   3.   4.  5.  6.  ____ Fleet Enema (as directed)   __X__ Use CHG Soap (or wipes) as directed  ____ Use Benzoyl Peroxide Gel as instructed  ____ Use inhalers on the day of surgery  ____ Stop metformin 2 days prior to surgery    ____ Take 1/2 of usual insulin dose the night before surgery. No insulin the morning  of surgery.   ____ Call your PCP, cardiologist, or Pulmonologist if taking Coumadin/Plavix/aspirin and ask when to stop before your surgery.   __X__ One Week prior to surgery- Stop Anti-inflammatories such as Ibuprofen, Aleve, Advil, Motrin, meloxicam (MOBIC), diclofenac, etodolac, ketorolac, Toradol, Daypro, piroxicam, aspirin, Goody's or BC powders. OK TO USE TYLENOL IF NEEDED   __X__ Stop ALL supplements until after surgery.    ____ Bring C-Pap to the hospital.    If you have any questions regarding your pre-procedure instructions,  Please call Pre-admit Testing at [phone]

## 2021-11-04 DIAGNOSIS — M545 Low back pain, unspecified: Secondary | ICD-10-CM | POA: Diagnosis not present

## 2021-11-06 ENCOUNTER — Ambulatory Visit: Payer: BC Managed Care – PPO | Admitting: Anesthesiology

## 2021-11-06 ENCOUNTER — Encounter
Admission: RE | Disposition: A | Discharge status: Home / Self Care | Payer: Self-pay | Source: Home / Self Care | Attending: Surgery

## 2021-11-06 ENCOUNTER — Other Ambulatory Visit: Payer: Self-pay

## 2021-11-06 ENCOUNTER — Ambulatory Visit
Admission: RE | Admit: 2021-11-06 | Discharge: 2021-11-06 | Disposition: A | Discharge status: Home / Self Care | Payer: BC Managed Care – PPO | Attending: Surgery | Admitting: Surgery

## 2021-11-06 ENCOUNTER — Encounter: Payer: Self-pay | Admitting: Surgery

## 2021-11-06 DIAGNOSIS — J8410 Pulmonary fibrosis, unspecified: Secondary | ICD-10-CM | POA: Diagnosis not present

## 2021-11-06 DIAGNOSIS — G4730 Sleep apnea, unspecified: Secondary | ICD-10-CM | POA: Insufficient documentation

## 2021-11-06 DIAGNOSIS — K4091 Unilateral inguinal hernia, without obstruction or gangrene, recurrent: Secondary | ICD-10-CM | POA: Diagnosis not present

## 2021-11-06 HISTORY — DX: Obstructive sleep apnea (adult) (pediatric): G47.33

## 2021-11-06 HISTORY — PX: INSERTION OF MESH: SHX5868

## 2021-11-06 HISTORY — DX: Mixed hyperlipidemia: E78.2

## 2021-11-06 HISTORY — DX: Activated protein C resistance: D68.51

## 2021-11-06 HISTORY — DX: Pulmonary fibrosis, unspecified: J84.10

## 2021-11-06 SURGERY — HERNIORRHAPHY, INGUINAL, ROBOT-ASSISTED, LAPAROSCOPIC
Anesthesia: General | Laterality: Left

## 2021-11-06 MED ORDER — PROPOFOL 10 MG/ML IV BOLUS
INTRAVENOUS | Status: AC
  Filled 2021-11-06: qty 20

## 2021-11-06 MED ORDER — PROPOFOL 10 MG/ML IV BOLUS
INTRAVENOUS | Status: DC | PRN
  Administered 2021-11-06: 130 mg via INTRAVENOUS

## 2021-11-06 MED ORDER — MIDAZOLAM HCL 2 MG/2ML IJ SOLN
INTRAMUSCULAR | Status: AC
  Filled 2021-11-06: qty 2

## 2021-11-06 MED ORDER — FAMOTIDINE 20 MG PO TABS
ORAL_TABLET | ORAL | Status: AC
  Filled 2021-11-06: qty 1

## 2021-11-06 MED ORDER — CEFAZOLIN SODIUM-DEXTROSE 2-4 GM/100ML-% IV SOLN
2.0000 g | INTRAVENOUS | Status: AC
  Administered 2021-11-06: 2 g via INTRAVENOUS

## 2021-11-06 MED ORDER — BUPIVACAINE LIPOSOME 1.3 % IJ SUSP
INTRAMUSCULAR | Status: AC
  Filled 2021-11-06: qty 20

## 2021-11-06 MED ORDER — GABAPENTIN 300 MG PO CAPS
ORAL_CAPSULE | ORAL | Status: AC
  Administered 2021-11-06: 300 mg via ORAL
  Filled 2021-11-06: qty 1

## 2021-11-06 MED ORDER — CHLORHEXIDINE GLUCONATE CLOTH 2 % EX PADS: 6.0000 | MEDICATED_PAD | Freq: Once | CUTANEOUS | Status: DC

## 2021-11-06 MED ORDER — CELECOXIB 200 MG PO CAPS: 200.0000 mg | ORAL_CAPSULE | ORAL | Status: AC

## 2021-11-06 MED ORDER — PROMETHAZINE HCL 25 MG/ML IJ SOLN: 6.2500 mg | INTRAMUSCULAR | Status: DC | PRN

## 2021-11-06 MED ORDER — FAMOTIDINE 20 MG PO TABS
20.0000 mg | ORAL_TABLET | Freq: Once | ORAL | Status: AC
  Administered 2021-11-06: 20 mg via ORAL

## 2021-11-06 MED ORDER — FENTANYL CITRATE (PF) 100 MCG/2ML IJ SOLN: 25.0000 ug | INTRAMUSCULAR | Status: DC | PRN

## 2021-11-06 MED ORDER — ONDANSETRON HCL 4 MG/2ML IJ SOLN
INTRAMUSCULAR | Status: DC | PRN
  Administered 2021-11-06: 4 mg via INTRAVENOUS

## 2021-11-06 MED ORDER — ROCURONIUM BROMIDE 100 MG/10ML IV SOLN
INTRAVENOUS | Status: DC | PRN
  Administered 2021-11-06: 20 mg via INTRAVENOUS
  Administered 2021-11-06: 50 mg via INTRAVENOUS

## 2021-11-06 MED ORDER — CELECOXIB 200 MG PO CAPS
ORAL_CAPSULE | ORAL | Status: AC
  Administered 2021-11-06: 200 mg via ORAL
  Filled 2021-11-06: qty 1

## 2021-11-06 MED ORDER — ACETAMINOPHEN 500 MG PO TABS
ORAL_TABLET | ORAL | Status: AC
  Filled 2021-11-06: qty 2

## 2021-11-06 MED ORDER — CEFAZOLIN SODIUM-DEXTROSE 2-4 GM/100ML-% IV SOLN
INTRAVENOUS | Status: AC
  Filled 2021-11-06: qty 100

## 2021-11-06 MED ORDER — FENTANYL CITRATE (PF) 100 MCG/2ML IJ SOLN
INTRAMUSCULAR | Status: AC
  Filled 2021-11-06: qty 2

## 2021-11-06 MED ORDER — BUPIVACAINE-EPINEPHRINE (PF) 0.25% -1:200000 IJ SOLN
INTRAMUSCULAR | Status: DC | PRN
  Administered 2021-11-06: 23 mL

## 2021-11-06 MED ORDER — FENTANYL CITRATE (PF) 100 MCG/2ML IJ SOLN
INTRAMUSCULAR | Status: DC | PRN
Start: 2021-11-06 — End: 2021-11-06
  Administered 2021-11-06: 25 ug via INTRAVENOUS
  Administered 2021-11-06: 50 ug via INTRAVENOUS

## 2021-11-06 MED ORDER — ROCURONIUM BROMIDE 10 MG/ML (PF) SYRINGE
PREFILLED_SYRINGE | INTRAVENOUS | Status: AC
  Filled 2021-11-06: qty 10

## 2021-11-06 MED ORDER — IBUPROFEN 800 MG PO TABS: 800.0000 mg | ORAL_TABLET | Freq: Three times a day (TID) | ORAL | 0 refills | Status: DC | PRN

## 2021-11-06 MED ORDER — ACETAMINOPHEN 500 MG PO TABS
1000.0000 mg | ORAL_TABLET | ORAL | Status: AC
  Administered 2021-11-06: 1000 mg via ORAL

## 2021-11-06 MED ORDER — SUGAMMADEX SODIUM 200 MG/2ML IV SOLN
INTRAVENOUS | Status: DC | PRN
  Administered 2021-11-06: 200 mg via INTRAVENOUS

## 2021-11-06 MED ORDER — LACTATED RINGERS IV SOLN: INTRAVENOUS | Status: DC

## 2021-11-06 MED ORDER — ORAL CARE MOUTH RINSE: 15.0000 mL | Freq: Once | OROMUCOSAL | Status: AC

## 2021-11-06 MED ORDER — BUPIVACAINE LIPOSOME 1.3 % IJ SUSP: 20.0000 mL | Freq: Once | INTRAMUSCULAR | Status: DC

## 2021-11-06 MED ORDER — HYDROCODONE-ACETAMINOPHEN 5-325 MG PO TABS: 1.0000 | ORAL_TABLET | Freq: Four times a day (QID) | ORAL | 0 refills | Status: DC | PRN

## 2021-11-06 MED ORDER — DEXAMETHASONE SODIUM PHOSPHATE 10 MG/ML IJ SOLN
INTRAMUSCULAR | Status: DC | PRN
  Administered 2021-11-06: 10 mg via INTRAVENOUS

## 2021-11-06 MED ORDER — LIDOCAINE HCL (CARDIAC) PF 100 MG/5ML IV SOSY
PREFILLED_SYRINGE | INTRAVENOUS | Status: DC | PRN
  Administered 2021-11-06: 50 mg via INTRAVENOUS

## 2021-11-06 MED ORDER — 0.9 % SODIUM CHLORIDE (POUR BTL) OPTIME
TOPICAL | Status: DC | PRN
  Administered 2021-11-06: 500 mL

## 2021-11-06 MED ORDER — BUPIVACAINE-EPINEPHRINE (PF) 0.25% -1:200000 IJ SOLN
INTRAMUSCULAR | Status: AC
  Filled 2021-11-06: qty 30

## 2021-11-06 MED ORDER — CHLORHEXIDINE GLUCONATE CLOTH 2 % EX PADS
6.0000 | MEDICATED_PAD | Freq: Once | CUTANEOUS | Status: AC
  Administered 2021-11-06: 6 via TOPICAL

## 2021-11-06 MED ORDER — CHLORHEXIDINE GLUCONATE 0.12 % MT SOLN: 15.0000 mL | Freq: Once | OROMUCOSAL | Status: AC

## 2021-11-06 MED ORDER — CHLORHEXIDINE GLUCONATE 0.12 % MT SOLN
OROMUCOSAL | Status: AC
  Administered 2021-11-06: 15 mL via OROMUCOSAL
  Filled 2021-11-06: qty 15

## 2021-11-06 MED ORDER — MIDAZOLAM HCL 2 MG/2ML IJ SOLN
INTRAMUSCULAR | Status: DC | PRN
  Administered 2021-11-06: 2 mg via INTRAVENOUS

## 2021-11-06 MED ORDER — DEXAMETHASONE SODIUM PHOSPHATE 10 MG/ML IJ SOLN
INTRAMUSCULAR | Status: AC
  Filled 2021-11-06: qty 1

## 2021-11-06 MED ORDER — ONDANSETRON HCL 4 MG/2ML IJ SOLN
INTRAMUSCULAR | Status: AC
  Filled 2021-11-06: qty 2

## 2021-11-06 MED ORDER — GABAPENTIN 300 MG PO CAPS: 300.0000 mg | ORAL_CAPSULE | ORAL | Status: AC

## 2021-11-06 SURGICAL SUPPLY — 50 items
ADH SKN CLS APL DERMABOND .7 (GAUZE/BANDAGES/DRESSINGS) ×2
BLADE CLIPPER SURG (BLADE) ×3 IMPLANT
COVER TIP SHEARS 8 DVNC (MISCELLANEOUS) ×2 IMPLANT
COVER TIP SHEARS 8MM DA VINCI (MISCELLANEOUS) ×1
COVER WAND RF STERILE (DRAPES) ×3 IMPLANT
DERMABOND ADVANCED (GAUZE/BANDAGES/DRESSINGS) ×1
DERMABOND ADVANCED .7 DNX12 (GAUZE/BANDAGES/DRESSINGS) ×2 IMPLANT
DRAPE ARM DVNC X/XI (DISPOSABLE) ×6 IMPLANT
DRAPE COLUMN DVNC XI (DISPOSABLE) ×2 IMPLANT
DRAPE DA VINCI XI ARM (DISPOSABLE) ×3
DRAPE DA VINCI XI COLUMN (DISPOSABLE) ×1
ELECT REM PT RETURN 9FT ADLT (ELECTROSURGICAL) ×3
ELECTRODE REM PT RTRN 9FT ADLT (ELECTROSURGICAL) ×2 IMPLANT
GLOVE ORTHO TXT STRL SZ7.5 (GLOVE) ×13 IMPLANT
GOWN STRL REUS W/ TWL LRG LVL3 (GOWN DISPOSABLE) ×2 IMPLANT
GOWN STRL REUS W/ TWL XL LVL3 (GOWN DISPOSABLE) ×4 IMPLANT
GOWN STRL REUS W/TWL LRG LVL3 (GOWN DISPOSABLE) ×6
GOWN STRL REUS W/TWL XL LVL3 (GOWN DISPOSABLE) ×6
GRASPER SUT TROCAR 14GX15 (MISCELLANEOUS) IMPLANT
IRRIGATION STRYKERFLOW (MISCELLANEOUS) IMPLANT
IRRIGATOR STRYKERFLOW (MISCELLANEOUS)
IV CATH ANGIO 14GX1.88 NO SAFE (IV SOLUTION) ×3 IMPLANT
IV NS 1000ML (IV SOLUTION)
IV NS 1000ML BAXH (IV SOLUTION) IMPLANT
KIT PINK PAD W/HEAD ARE REST (MISCELLANEOUS) ×3
KIT PINK PAD W/HEAD ARM REST (MISCELLANEOUS) ×2 IMPLANT
LABEL OR SOLS (LABEL) ×3 IMPLANT
MANIFOLD NEPTUNE II (INSTRUMENTS) ×3 IMPLANT
MESH 3DMAX LIGHT 4.1X6.2 LT LR (Mesh General) ×1 IMPLANT
NDL INSUFFLATION 14GA 120MM (NEEDLE) IMPLANT
NEEDLE HYPO 22GX1.5 SAFETY (NEEDLE) ×3 IMPLANT
NEEDLE INSUFFLATION 14GA 120MM (NEEDLE) IMPLANT
PACK LAP CHOLECYSTECTOMY (MISCELLANEOUS) ×3 IMPLANT
SEAL CANN UNIV 5-8 DVNC XI (MISCELLANEOUS) ×6 IMPLANT
SEAL XI 5MM-8MM UNIVERSAL (MISCELLANEOUS) ×3
SET TUBE SMOKE EVAC HIGH FLOW (TUBING) ×3 IMPLANT
SOLUTION ELECTROLUBE (MISCELLANEOUS) ×3 IMPLANT
SUT MNCRL 4-0 (SUTURE) ×3
SUT MNCRL 4-0 27XMFL (SUTURE) ×2
SUT STRATAFIX 0 PDS+ CT-2 23 (SUTURE)
SUT V-LOC 90 ABS 3-0 VLT  V-20 (SUTURE)
SUT V-LOC 90 ABS 3-0 VLT V-20 (SUTURE) IMPLANT
SUT VIC AB 0 CT2 27 (SUTURE) ×3 IMPLANT
SUT VIC AB 2-0 RB1 27 (SUTURE) ×3 IMPLANT
SUT VLOC 90 2/L VL 12 GS22 (SUTURE) IMPLANT
SUT VLOC 90 S/L VL9 GS22 (SUTURE) ×1 IMPLANT
SUTURE MNCRL 4-0 27XMF (SUTURE) ×2 IMPLANT
SUTURE STRATFX 0 PDS+ CT-2 23 (SUTURE) IMPLANT
TROCAR Z-THREAD FIOS 11X100 BL (TROCAR) ×1 IMPLANT
WATER STERILE IRR 500ML POUR (IV SOLUTION) ×2 IMPLANT

## 2021-11-06 NOTE — Op Note (Signed)
Robotic assisted Laparoscopic Transabdominal Left Recurrent Inguinal Hernia Repair with Mesh       Pre-operative Diagnosis:  Left Recurrent Inguinal Hernia   Post-operative Diagnosis: Same   Procedure: Robotic assisted Laparoscopic  repair of left recurrent inguinal hernia(s)   Surgeon: Campbell Lerner, M.D., FACS   Anesthesia: GETA   Findings: Left recurrent inguinal hernia, no evidence of significant right sided hernia.         Procedure Details  The patient was seen again in the Holding Room. The benefits, complications, treatment options, and expected outcomes were discussed with the patient. The risks of bleeding, infection, recurrence of symptoms, failure to resolve symptoms, recurrence of hernia, ischemic orchitis, chronic pain syndrome or neuroma, were reviewed again. The likelihood of improving the patient's symptoms with return to their baseline status is good.  The patient and/or family concurred with the proposed plan, giving informed consent.  The patient was taken to Operating Room, identified  and the procedure verified as Laparoscopic Inguinal Hernia Repair. Laterality confirmed.  A Time Out was held and the above information confirmed.   Prior to the induction of general anesthesia, antibiotic prophylaxis was administered. VTE prophylaxis was in place. General endotracheal anesthesia was then administered and tolerated well. After the induction, the abdomen was prepped with Chloraprep and draped in the sterile fashion. The patient was positioned in the supine position.   After local infiltration of quarter percent Marcaine with epinephrine, stab incision was made left upper quadrant.  On the left at Palmer's point, the Veress needle is passed with sensation of the layers to penetrate the abdominal wall and into the peritoneum.  Saline drop test is confirmed peritoneal placement.  Insufflation is initiated with carbon dioxide to pressures of 15 mmHg. An 8.5 mm port is placed to  the left off of the midline, with blunt tipped trocar.  Pneumoperitoneum maintained w/o HD changes using the AirSeal to pressures of 15 mm Hg with CO2. No evidence of bowel injuries.  Two 8.5 mm ports placed under direct vision in each upper quadrant. The laparoscopy revealed a left sided direct defect.   The robot was brought ot the table and docked in the standard fashion, no collision between arms was observed. Instruments were kept under direct view at all times. For left inguinal hernia repair,  I developed a peritoneal flap. The sac(s) were reduced and dissected free from adjacent structures. We preserved the vas and the vessels, and visualized them to their convergence and beyond in the retroperitoneum. Once dissection was completed a large left sided BARD 3D Light mesh was placed and secured at three points with interrupted 0 Vicryl to the pubic tubercle and anteriorly. There was good coverage of the direct, indirect and femoral spaces.  Second look revealed no complications or injuries.  The flap was then closed with 2-0 V-lock suture.  Peritoneal closure without defects.   Once assuring that hemostasis was adequate, all needles/sponges removed, and the robot was undocked.  The ports were removed, the abdomen desulflated.  4-0 subcuticular Monocryl was used at all skin edges. Dermabond was placed.  Patient tolerated the procedure well. There were no complications. He was taken to the recovery room in stable condition.           Campbell Lerner, M.D., FACS 11/06/2021, 12:09 PM

## 2021-11-06 NOTE — Anesthesia Preprocedure Evaluation (Signed)
Anesthesia Evaluation  Patient identified by MRN, date of birth, ID band Patient awake    Reviewed: Allergy & Precautions, H&P , NPO status , Patient's Chart, lab work & pertinent test results, reviewed documented beta blocker date and time   History of Anesthesia Complications Negative for: history of anesthetic complications  Airway Mallampati: II  TM Distance: >3 FB Neck ROM: full    Dental  (+) Dental Advidsory Given, Caps, Teeth Intact   Pulmonary neg shortness of breath, sleep apnea and Continuous Positive Airway Pressure Ventilation , neg COPD, neg recent URI,  Pulmonary fibrosis   Pulmonary exam normal breath sounds clear to auscultation       Cardiovascular Exercise Tolerance: Good negative cardio ROS Normal cardiovascular exam Rhythm:regular Rate:Normal     Neuro/Psych negative neurological ROS  negative psych ROS   GI/Hepatic negative GI ROS, Neg liver ROS,   Endo/Other  negative endocrine ROS  Renal/GU negative Renal ROS  negative genitourinary   Musculoskeletal   Abdominal   Peds  Hematology negative hematology ROS (+)   Anesthesia Other Findings Past Medical History: 01/29/2020: Acute hypoxemic respiratory failure due to COVID-19 [facility]) No date: Factor V Leiden ([facility]) 09/2021: Inguinal hernia No date: 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[facility]) No date: Mixed hyperlipidemia No date: OSA on CPAP 01/29/2020: Pneumonia due to COVID-19 virus 01/2020: Sepsis due to COVID-19 ([facility])   Reproductive/Obstetrics negative OB ROS                             Anesthesia Physical Anesthesia Plan  ASA: 2  Anesthesia Plan: General   Post-op Pain Management:    Induction: Intravenous  PONV Risk Score and Plan: 2 and Ondansetron, Dexamethasone, Treatment may vary due to age or medical condition and Midazolam  Airway Management Planned: Oral ETT  Additional Equipment:   Intra-op Plan:    Post-operative Plan: Extubation in OR  Informed Consent: I have reviewed the patients History and Physical, chart, labs and discussed the procedure including the risks, benefits and alternatives for the proposed anesthesia with the patient or authorized representative who has indicated his/her understanding and acceptance.     Dental Advisory Given  Plan Discussed with: Anesthesiologist, CRNA and Surgeon  Anesthesia Plan Comments:         Anesthesia Quick Evaluation

## 2021-11-06 NOTE — Interval H&P Note (Signed)
History and Physical Interval Note:  11/06/2021 10:26 AM  John Wong  has presented today for surgery, with the diagnosis of recurrent inguinal hernia.  The various methods of treatment have been discussed with the patient and family. After consideration of risks, benefits and other options for treatment, the patient has consented to  Procedure(s): XI ROBOTIC ASSISTED INGUINAL HERNIA, recurrent, possible bilateral (Left) as a surgical intervention.  The patient's history has been reviewed, patient examined, no change in status, stable for surgery.  I have reviewed the patient's chart and labs.  Questions were answered to the patient's satisfaction.    The left side is marked, although the procedure may be bilateral.   Ronny Bacon

## 2021-11-06 NOTE — Anesthesia Procedure Notes (Signed)
Procedure Name: Intubation Date/Time: 11/06/2021 10:56 AM  Performed by: Carmelina Paddock, RNPre-anesthesia Checklist: Patient identified, Patient being monitored, Timeout performed, Emergency Drugs available and Suction available Patient Re-evaluated:Patient Re-evaluated prior to induction Oxygen Delivery Method: Circle system utilized Preoxygenation: Pre-oxygenation with 100% oxygen Induction Type: IV induction Ventilation: Mask ventilation without difficulty Laryngoscope Size: Mac, 3 and 4 Grade View: Grade I Tube type: Oral Tube size: 7.5 mm Number of attempts: 1 Airway Equipment and Method: Stylet Placement Confirmation: ETT inserted through vocal cords under direct vision, positive ETCO2 and breath sounds checked- equal and bilateral Secured at: 23 cm Tube secured with: Tape Dental Injury: Teeth and Oropharynx as per pre-operative assessment

## 2021-11-06 NOTE — Anesthesia Postprocedure Evaluation (Signed)
Anesthesia Post Note  Patient: Caid Radin  Procedure(s) Performed: XI ROBOTIC ASSISTED INGUINAL HERNIA, recurrent, possible bilateral (Left) INSERTION OF MESH  Patient location during evaluation: PACU Anesthesia Type: General Level of consciousness: awake and alert Pain management: pain level controlled Vital Signs Assessment: post-procedure vital signs reviewed and stable Respiratory status: spontaneous breathing, nonlabored ventilation, respiratory function stable and patient connected to nasal cannula oxygen Cardiovascular status: blood pressure returned to baseline and stable Postop Assessment: no apparent nausea or vomiting Anesthetic complications: no   No notable events documented.   Last Vitals:  Vitals:   11/06/21 1300 11/06/21 1315  BP: 119/71 131/74  Pulse: 63 64  Resp: 10 18  Temp:  36.9 C  SpO2: 97% 96%    Last Pain:  Vitals:   11/06/21 1315  TempSrc: Temporal  PainSc: 2                  Lenard Simmer

## 2021-11-06 NOTE — Transfer of Care (Signed)
Immediate Anesthesia Transfer of Care Note  Patient: John Wong  Procedure(s) Performed: XI ROBOTIC ASSISTED INGUINAL HERNIA, recurrent, possible bilateral (Left) INSERTION OF MESH  Patient Location: PACU  Anesthesia Type:General  Level of Consciousness: drowsy  Airway & Oxygen Therapy: Patient Spontanous Breathing and Patient connected to face mask  Post-op Assessment: Report given to RN and Post -op Vital signs reviewed and stable  Post vital signs: Reviewed and stable  Last Vitals:  Vitals Value Taken Time  BP 133/78 11/06/21 1211  Temp    Pulse 68 11/06/21 1213  Resp 15 11/06/21 1213  SpO2 100 % 11/06/21 1213  Vitals shown include unvalidated device data.  Last Pain:  Vitals:   11/06/21 0757  TempSrc: Temporal  PainSc: 0-No pain         Complications: No notable events documented.

## 2021-11-06 NOTE — Discharge Instructions (Signed)

## 2021-11-07 ENCOUNTER — Encounter: Payer: Self-pay | Admitting: Surgery

## 2021-11-12 ENCOUNTER — Encounter: Payer: BC Managed Care – PPO | Admitting: Surgery

## 2021-11-12 ENCOUNTER — Encounter: Payer: Self-pay | Admitting: Surgery

## 2021-11-12 ENCOUNTER — Ambulatory Visit (INDEPENDENT_AMBULATORY_CARE_PROVIDER_SITE_OTHER): Payer: BC Managed Care – PPO | Admitting: Surgery

## 2021-11-12 VITALS — BP 129/75 | HR 62 | Temp 98.9°F | Ht 70.0 in | Wt 189.0 lb

## 2021-11-12 DIAGNOSIS — Z8719 Personal history of other diseases of the digestive system: Secondary | ICD-10-CM | POA: Insufficient documentation

## 2021-11-12 DIAGNOSIS — Z09 Encounter for follow-up examination after completed treatment for conditions other than malignant neoplasm: Secondary | ICD-10-CM

## 2021-11-12 DIAGNOSIS — K4091 Unilateral inguinal hernia, without obstruction or gangrene, recurrent: Secondary | ICD-10-CM

## 2021-11-12 NOTE — Patient Instructions (Addendum)
You may take Ibuprofen for any discomfort. Follow up here as scheduled.   GENERAL POST-OPERATIVE PATIENT INSTRUCTIONS   WOUND CARE INSTRUCTIONS:  If the wound becomes bright red and painful or starts to drain infected material that is not clear, please contact your physician immediately.  If the wound is mildly pink and has a thick firm ridge underneath it, this is normal, and is referred to as a healing ridge.  This will resolve over the next 4-6 weeks.  BATHING: You may shower if you have been informed of this by your surgeon. However, Please do not submerge in a tub, hot tub, or pool until incisions are completely sealed or have been told by your surgeon that you may do so.  DIET:  You may eat any foods that you can tolerate.  It is a good idea to eat a high fiber diet and take in plenty of fluids to prevent constipation.  If you do become constipated you may want to take a mild laxative or take ducolax tablets on a daily basis until your bowel habits are regular.  Constipation can be very uncomfortable, along with straining, after recent surgery.   ACTIVITY: You should not lift more than 20 pounds for 6 weeks total after surgery as it could put you at increased risk for complications.  Twenty pounds is roughly equivalent to a plastic bag of groceries. At that time- Listen to your body when lifting, if you have pain when lifting, stop and then try again in a few days. Soreness after doing exercises or activities of daily living is normal as you get back in to your normal routine.  MEDICATIONS:  Try to take narcotic medications and anti-inflammatory medications, such as tylenol, ibuprofen, naprosyn, etc., with food.  This will minimize stomach upset from the medication.  Should you develop nausea and vomiting from the pain medication, or develop a rash, please discontinue the medication and contact your physician.  You should not drive, make important decisions, or operate machinery when taking  narcotic pain medication.  SUNBLOCK Use sun block to incision area over the next year if this area will be exposed to sun. This helps decrease scarring and will allow you avoid a permanent darkened area over your incision.  QUESTIONS:  Please feel free to call our office if you have any questions, and we will be glad to assist you. [phone]

## 2021-11-12 NOTE — Progress Notes (Signed)
[facility] POST-OP OFFICE VISIT  11/12/2021  HPI: John Wong is a 66 y.o. male 6 days s/p robotic repair of left recurrent inguinal hernia.  He reports having developed impaction/constipation that had to be dealt with on Sunday.  Spent 8 hours in agony trying to get his bowels moving.  Quite concerned that there is a knot at the site of his previous hernia and feels there may be a recurrence.  Also senses a degree of pulling sensation in the area.  Vital signs: BP 129/75   Pulse 62   Temp 98.9 F (37.2 C)   Ht 5\' 10"  (1.778 m)   Wt 189 lb (85.7 kg)   SpO2 99%   BMI 27.12 kg/m    Physical Exam: Constitutional: He appears well Abdomen: Soft and benign. Skin: Pseudohernia present medially, without change on Valsalva.  No evidence of edema or ecchymosis in the groin area.  Assessment/Plan: This is a 66 y.o. male 6 days s/p robotic repair of left recurrent inguinal hernia, I suspect pseudohernia slightly more symptomatic due to recent straining for with constipation.  Anticipating this will improve with time.  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   Recurrent left inguinal hernia 10/22/2021   Right inguinal hernia 10/22/2021   Diverticulitis of colon 10/22/2021   History of colonic polyps 10/22/2021   AKI (acute kidney injury) (HCC) 02/07/2020   Pneumonia of both lungs due to methicillin susceptible Staphylococcus aureus (MSSA) (HCC) 02/07/2020   Sepsis due to COVID-19 (HCC) 02/01/2020   Acute hypoxemic respiratory failure due to COVID-19 (HCC) 01/31/2020   OSA (obstructive sleep apnea) 01/31/2020   COVID-19 01/29/2020   Aftercare following surgery of the musculoskeletal system 05/14/2017   Primary osteoarthritis of right knee 09/01/2016    -Reassurance is given, we will see patient back in 9 days as scheduled.   11/01/2016 M.D., FACS 11/12/2021, 2:14 PM

## 2021-11-21 ENCOUNTER — Ambulatory Visit (INDEPENDENT_AMBULATORY_CARE_PROVIDER_SITE_OTHER): Payer: BC Managed Care – PPO | Admitting: Physician Assistant

## 2021-11-21 ENCOUNTER — Encounter: Payer: Self-pay | Admitting: Physician Assistant

## 2021-11-21 VITALS — BP 137/83 | HR 73 | Temp 98.3°F | Wt 193.8 lb

## 2021-11-21 DIAGNOSIS — Z8719 Personal history of other diseases of the digestive system: Secondary | ICD-10-CM

## 2021-11-21 DIAGNOSIS — Z09 Encounter for follow-up examination after completed treatment for conditions other than malignant neoplasm: Secondary | ICD-10-CM

## 2021-11-21 DIAGNOSIS — K4091 Unilateral inguinal hernia, without obstruction or gangrene, recurrent: Secondary | ICD-10-CM

## 2021-11-21 NOTE — Patient Instructions (Signed)

## 2021-11-21 NOTE — Progress Notes (Signed)
[facility] POST-OP OFFICE VISIT  11/21/2021  HPI: John Wong is a 66 y.o. male 15 days s/p robotic assisted left inguinal hernia repair with Dr Claudine Mouton  He was seen on 06/20 secondary to severe constipation, straining, and a bulge in his left inguinal region.   Since then, he reports he is doing much better. Constipation has resolved. Pain is down to a soreness at most. He still feels a small bulge in the left inguinal region but thinks this is getting smaller. He has tried to "do the cough test" and did not seem to appreciate any worsening. Otherwise, no fever, chills, nausea, emesis. Incisions are healing well. No other complaints.   Vital signs: BP 137/83   Pulse 73   Temp 98.3 F (36.8 C) (Oral)   Wt 193 lb 12.8 oz (87.9 kg)   SpO2 97%   BMI 27.81 kg/m    Physical Exam: Constitutional: Well appearing male, NAD Abdomen: Soft, non-tender, non-distended, no rebound/guarding. In the left inguinal region, there is a small area of induration medially. I suspect this is anticipated post-operatively. There is no evidence of recurrence on examination nor with valsalva maneuvers laying flat nor standing.  Skin: Laparoscopic incisions are healing well, no erythema or drainage   Assessment/Plan: This is a 66 y.o. male 15 days s/p robotic assisted left inguinal hernia repair   - Pain control prn  - Reviewed wound care recommendation  - Reviewed lifting restrictions; 6 weeks total  - He can follow up on as needed basis; He understands to call with questions/concerns  -- Lynden Oxford, PA-C [facility] 11/21/2021, 2:32 PM M-F: 7am - 4pm

## 2022-01-20 DIAGNOSIS — Z0001 Encounter for general adult medical examination with abnormal findings: Secondary | ICD-10-CM | POA: Diagnosis not present

## 2022-01-20 DIAGNOSIS — R42 Dizziness and giddiness: Secondary | ICD-10-CM | POA: Diagnosis not present

## 2022-01-20 DIAGNOSIS — B353 Tinea pedis: Secondary | ICD-10-CM | POA: Diagnosis not present

## 2022-01-28 DIAGNOSIS — Z13220 Encounter for screening for lipoid disorders: Secondary | ICD-10-CM | POA: Diagnosis not present

## 2022-01-28 DIAGNOSIS — R42 Dizziness and giddiness: Secondary | ICD-10-CM | POA: Diagnosis not present

## 2022-01-28 DIAGNOSIS — Z0000 Encounter for general adult medical examination without abnormal findings: Secondary | ICD-10-CM | POA: Diagnosis not present

## 2023-06-24 ENCOUNTER — Encounter (HOSPITAL_BASED_OUTPATIENT_CLINIC_OR_DEPARTMENT_OTHER): Payer: Self-pay | Admitting: Radiology

## 2023-06-24 ENCOUNTER — Emergency Department (HOSPITAL_BASED_OUTPATIENT_CLINIC_OR_DEPARTMENT_OTHER)
Admission: EM | Admit: 2023-06-24 | Discharge: 2023-06-24 | Disposition: A | Discharge status: Home / Self Care | Payer: Medicare Other | Attending: Emergency Medicine | Admitting: Emergency Medicine

## 2023-06-24 ENCOUNTER — Other Ambulatory Visit: Payer: Self-pay

## 2023-06-24 ENCOUNTER — Emergency Department (HOSPITAL_BASED_OUTPATIENT_CLINIC_OR_DEPARTMENT_OTHER): Payer: Medicare Other

## 2023-06-24 DIAGNOSIS — R0600 Dyspnea, unspecified: Secondary | ICD-10-CM

## 2023-06-24 DIAGNOSIS — Z7982 Long term (current) use of aspirin: Secondary | ICD-10-CM | POA: Insufficient documentation

## 2023-06-24 DIAGNOSIS — R0602 Shortness of breath: Secondary | ICD-10-CM | POA: Insufficient documentation

## 2023-06-24 LAB — BASIC METABOLIC PANEL
Anion gap: 6 (ref 5–15)
BUN: 20 mg/dL (ref 8–23)
CO2: 25 mmol/L (ref 22–32)
Calcium: 8.5 mg/dL — ABNORMAL LOW (ref 8.9–10.3)
Chloride: 107 mmol/L (ref 98–111)
Creatinine, Ser: 1 mg/dL (ref 0.61–1.24)
GFR, Estimated: >60 mL/min (ref >60)
Glucose, Bld: 118 mg/dL — ABNORMAL HIGH (ref 70–99)
Potassium: 3.7 mmol/L (ref 3.5–5.1)
Sodium: 138 mmol/L (ref 135–145)

## 2023-06-24 LAB — CBC WITH DIFFERENTIAL/PLATELET
Abs Immature Granulocytes: 0.02 10*3/uL (ref 0.00–0.07)
Basophils Absolute: 0 10*3/uL (ref 0.0–0.1)
Basophils Relative: 1 %
Eosinophils Absolute: 0.1 10*3/uL (ref 0.0–0.5)
Eosinophils Relative: 2 %
HCT: 45.6 % (ref 39.0–52.0)
Hemoglobin: 15.5 g/dL (ref 13.0–17.0)
Immature Granulocytes: 0 %
Lymphocytes Relative: 20 %
Lymphs Abs: 1.4 10*3/uL (ref 0.7–4.0)
MCH: 28.3 pg (ref 26.0–34.0)
MCHC: 34 g/dL (ref 30.0–36.0)
MCV: 83.2 fL (ref 80.0–100.0)
Monocytes Absolute: 0.6 10*3/uL (ref 0.1–1.0)
Monocytes Relative: 9 %
Neutro Abs: 4.7 10*3/uL (ref 1.7–7.7)
Neutrophils Relative %: 68 %
Platelets: 206 10*3/uL (ref 150–400)
RBC: 5.48 MIL/uL (ref 4.22–5.81)
RDW: 13.4 % (ref 11.5–15.5)
WBC: 6.9 10*3/uL (ref 4.0–10.5)
nRBC: 0 % (ref 0.0–0.2)

## 2023-06-24 LAB — TROPONIN I (HIGH SENSITIVITY): Troponin I (High Sensitivity): 3 ng/L (ref <18)

## 2023-06-24 LAB — BRAIN NATRIURETIC PEPTIDE: B Natriuretic Peptide: 40.5 pg/mL (ref 0.0–100.0)

## 2023-06-24 NOTE — Discharge Instructions (Signed)
Follow-up with your primary doctor as scheduled, and return to the ER if you develop severe chest pain, worsening breathing, or for other new and concerning symptoms.

## 2023-06-24 NOTE — ED Provider Notes (Signed)
[facility] Provider Note   CSN: [id number] Arrival date & time: 06/24/23  0149     History  Chief Complaint  Patient presents with   Shortness of Breath    John Wong is a 68 y.o. male.  Patient is a 69 year old male with history of obstructive sleep apnea, prolonged hospitalization due to respiratory failure from COVID-19.  Patient presenting today for evaluation of difficulty breathing.  For the past several weeks, he has noticed waking up in the night gasping for air and feeling as if he could not breathe.  The symptoms do not occur during the day, only at night while he is sleeping.  He denies any fevers or chills.  No productive cough.  He was diagnosed with sleep apnea in the past and was previously using a CPAP machine.  He has not been using this for the past year as he believes he no longer needs it due to weight loss.  No leg swelling.  No chest pain.  The history is provided by the patient.       Home Medications Prior to Admission medications   Medication Sig Start Date End Date Taking? Authorizing Provider  aspirin EC 81 MG tablet Take 81 mg by mouth daily. Swallow whole.    [provider]  cholecalciferol (VITAMIN D3) 25 MCG (1000 UNIT) tablet Take 1,000 Units by mouth daily.    [provider]  magnesium gluconate (MAGONATE) 500 MG tablet Take 500 mg by mouth 2 (two) times daily.    [provider]  MELATONIN PO Take 5 mg by mouth at bedtime.    [provider]  Multiple Vitamins-Minerals (MULTIVITAMIN ADULTS 50+ PO) Take by mouth.    [provider]  Multiple Vitamins-Minerals (STRESS FORMULA 500/ZINC PO) Take by mouth.    [provider]  Omega-3 Fatty Acids (FISH OIL) 1200 MG CAPS Take by mouth.    [provider]  QUERCETIN PO Take by mouth.    [provider]  SUPER B COMPLEX/C PO Take by mouth.    [provider]  vitamin C  (ASCORBIC ACID) 500 MG tablet Take 500 mg by mouth daily.    [provider]  vitamin E 180 MG (400 UNITS) capsule Take 400 Units by mouth daily.    [provider]      Allergies    Patient has no known allergies.    Review of Systems   Review of Systems  All other systems reviewed and are negative.   Physical Exam Updated Vital Signs BP (!) 141/94 (BP Location: Left Arm)   Pulse 66   Temp 99 F (37.2 C) (Oral)   Resp 18   Ht 5\' 10"  (1.778 m)   Wt 89.8 kg   SpO2 100%   BMI 28.41 kg/m  Physical Exam Vitals and nursing note reviewed.  Constitutional:      General: He is not in acute distress.    Appearance: He is well-developed. He is not diaphoretic.  HENT:     Head: Normocephalic and atraumatic.  Cardiovascular:     Rate and Rhythm: Normal rate and regular rhythm.     Heart sounds: No murmur heard.    No friction rub.  Pulmonary:     Effort: Pulmonary effort is normal. No respiratory distress.     Breath sounds: Normal breath sounds. No wheezing or rales.  Abdominal:     General: Bowel sounds are normal. There is  no distension.     Palpations: Abdomen is soft.     Tenderness: There is no abdominal tenderness.  Musculoskeletal:        General: Normal range of motion.     Cervical back: Normal range of motion and neck supple.     Right lower leg: No tenderness. No edema.     Left lower leg: No tenderness. No edema.  Skin:    General: Skin is warm and dry.  Neurological:     Mental Status: He is alert and oriented to person, place, and time.     Coordination: Coordination normal.     ED Results / Procedures / Treatments   Labs (all labs ordered are listed, but only abnormal results are displayed) Labs Reviewed  BASIC METABOLIC PANEL  CBC WITH DIFFERENTIAL/PLATELET  BRAIN NATRIURETIC PEPTIDE  TROPONIN I (HIGH SENSITIVITY)    EKG EKG Interpretation Date/Time:  Wednesday June 24 2023 02:01:54 EST Ventricular Rate:  61 PR  Interval:  219 QRS Duration:  80 QT Interval:  383 QTC Calculation: 386 R Axis:   -3  Text Interpretation: Sinus rhythm Borderline prolonged PR interval Low voltage, precordial leads Abnormal R-wave progression, early transition No significant change since 10/15/2021 Confirmed by Geoffery Lyons ([id number]) on 06/24/2023 2:10:29 AM  Radiology DG Chest 2 View Result Date: 06/24/2023 CLINICAL DATA:  Shortness of breath EXAM: CHEST - 2 VIEW COMPARISON:  None Available. FINDINGS: The heart size and mediastinal contours are within normal limits. There some linear scarring or atelectasis in the right mid and lower lung. There is no lung consolidation, pleural effusion or pneumothorax. The visualized skeletal structures are unremarkable. IMPRESSION: Linear scarring or atelectasis in the right mid and lower lung. Electronically Signed   By: Darliss Cheney M.D.   On: 06/24/2023 02:23    Procedures Procedures    Medications Ordered in ED Medications - No data to display  ED Course/ Medical Decision Making/ A&P  Patient is a 68 year old male presenting with complaints of shortness of breath as described in the HPI.  These episodes occur at night and have been happening for the past several weeks.  Patient is asymptomatic at the time of presentation.  He has stable vital signs and is afebrile.  There is no hypoxia.  Physical examination reveals clear lungs and no distress.  Laboratory studies obtained including CBC, metabolic panel, BNP, and troponin, all of which are unremarkable.  Chest x-ray shows no acute process.  I highly suspect some sort of sleep apnea.  Patient has had this in the past, but has not been using his CPAP machine for the past year.  I have advised him to try using this again and see if this helps.  Nothing tonight appears emergent.  No evidence for congestive heart failure, pneumonia, pneumothorax, or other acute issue.  He has an upcoming appointment tomorrow with his primary doctor and  will address this issue then with him.  Final Clinical Impression(s) / ED Diagnoses Final diagnoses:  None    Rx / DC Orders ED Discharge Orders     None         Geoffery Lyons, MD 06/24/23 [phone]

## 2023-06-24 NOTE — ED Triage Notes (Signed)
Pt states for the past 2-3 weeks he has been waking up short of breath. Pt states some may be anxiety but it feels different tonight that it has before. Pt states that last weekend he was moving wood and felt a weird pain under his jaw. Pt states the shortness of breath isn't every night but it is coming more frequently. Pt states what made him come in tonight is that when he woke his felt clammy.

## 2023-07-13 IMAGING — CT CT ABD-PELV W/ CM
2 of 5 series · 16 of 46 positions shown, 18 images · IV contrast (Omnipaque)
Comparison: None

CLINICAL DATA: Abdominal pain, acute, nonlocalized. Lower abdominal
pain over the last 3 days.

EXAM:
CT ABDOMEN AND PELVIS WITH CONTRAST
TECHNIQUE: Multidetector CT imaging of the abdomen and pelvis was performed
using the standard protocol following bolus administration of
intravenous contrast.

[Series 2: axial st · axial · 0.92mm/px · z∈[-608,-118]mm · 13 of 112 slices shown, 15 images]
[im 7/112  soft-tissue]
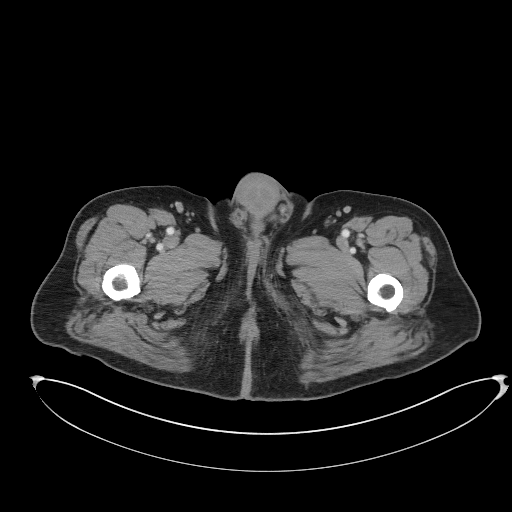
[im 7/112  bone]
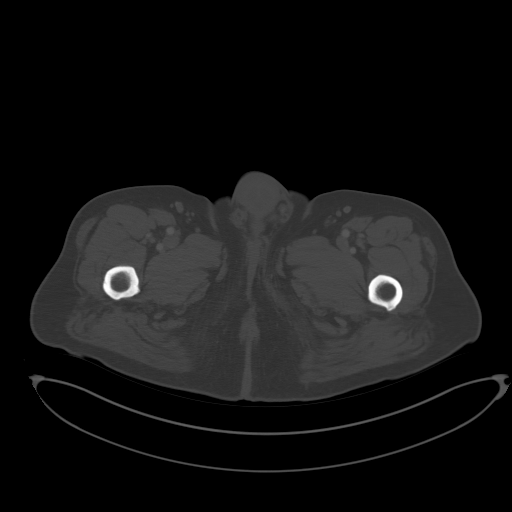
[im 13/112  soft-tissue]
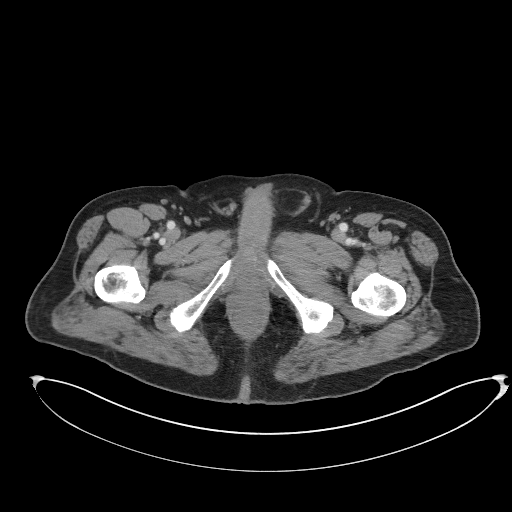
[im 25/112  soft-tissue]
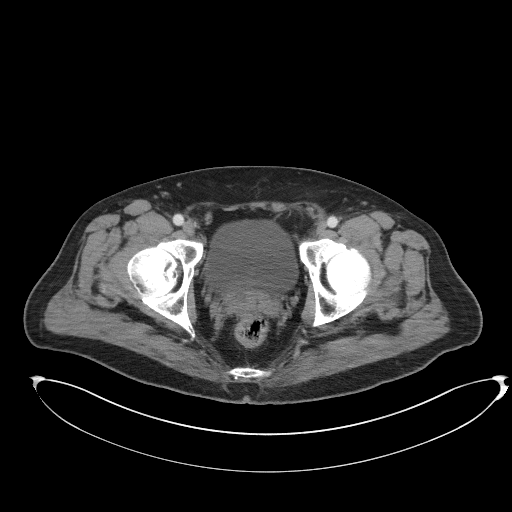
[im 31/112  soft-tissue]
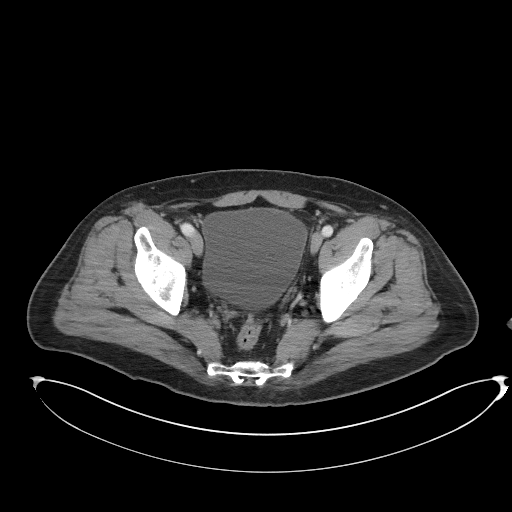
[im 38/112  soft-tissue]
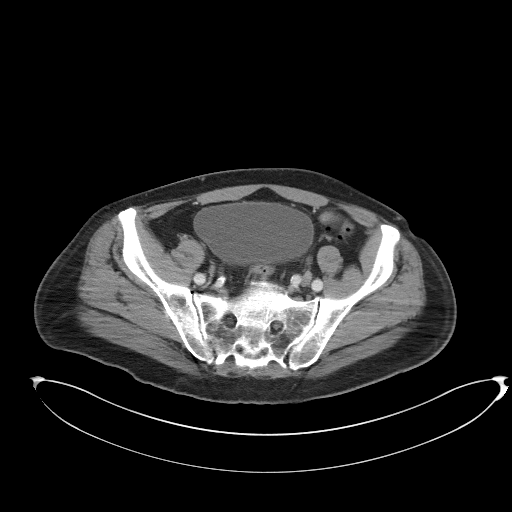
[im 50/112  soft-tissue]
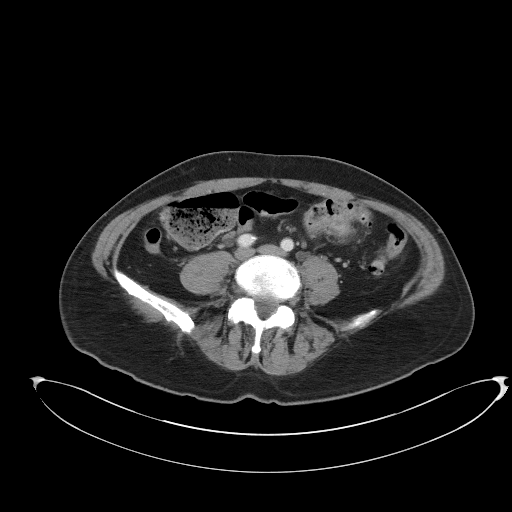
[im 56/112  soft-tissue]
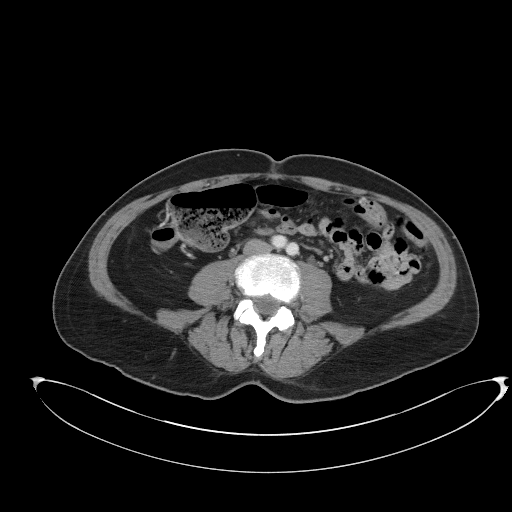
[im 62/112  soft-tissue]
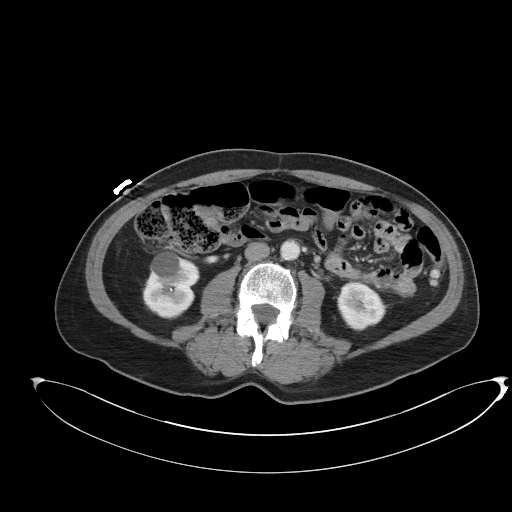
[im 75/112  soft-tissue]
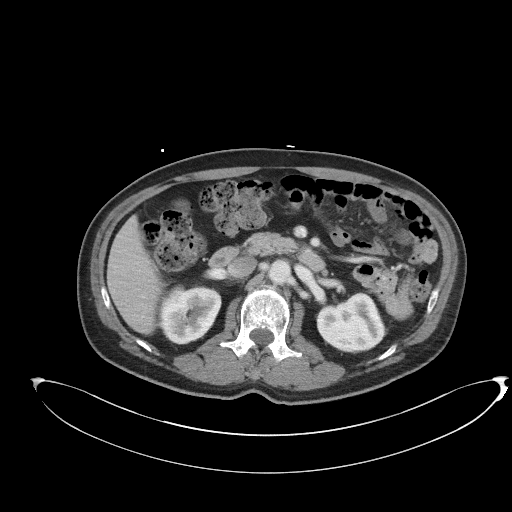
[im 75/112  bone]
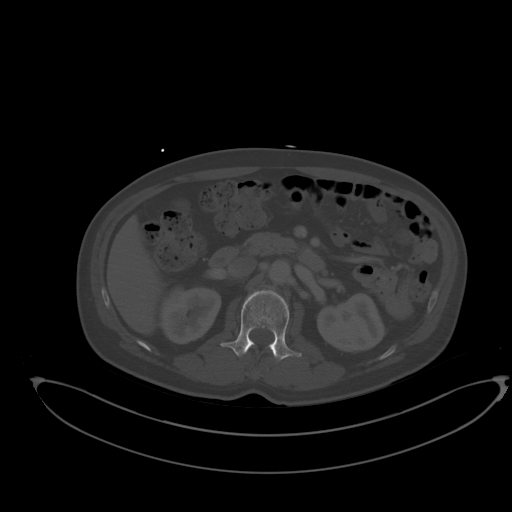
[im 81/112  soft-tissue]
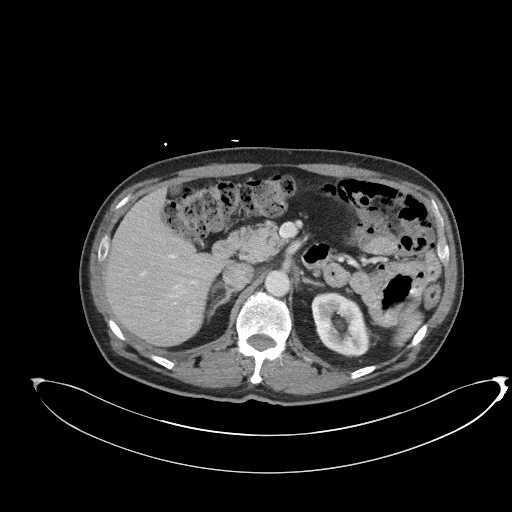
[im 87/112  soft-tissue]
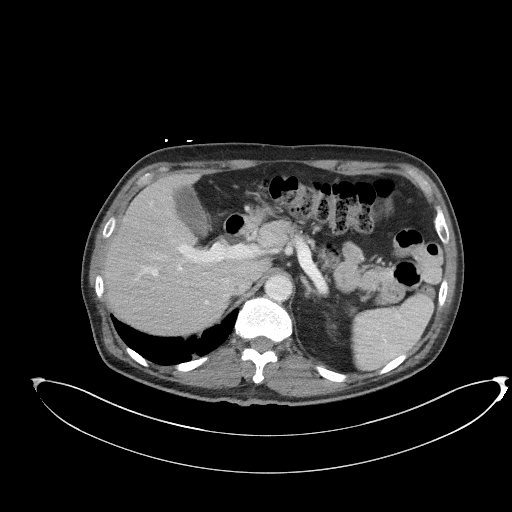
[im 99/112  soft-tissue]
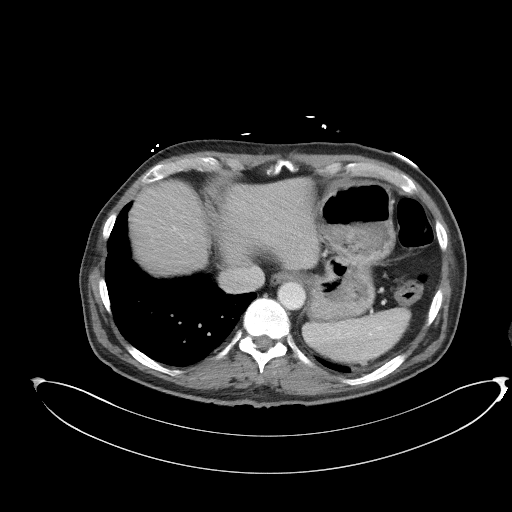
[im 105/112  soft-tissue]
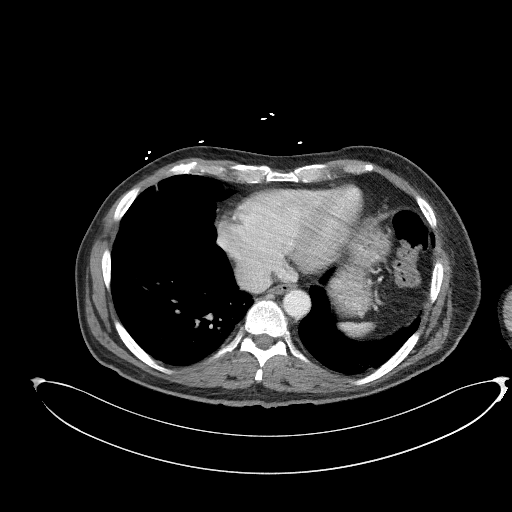

[Series 5: coronal st · coronal · 0.87mm/px · 3 of 84 slices shown]
[im 28/84  soft-tissue]
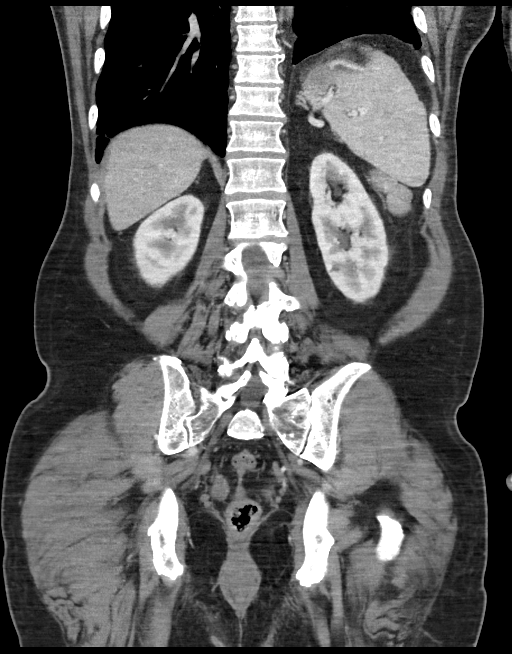
[im 37/84  soft-tissue]
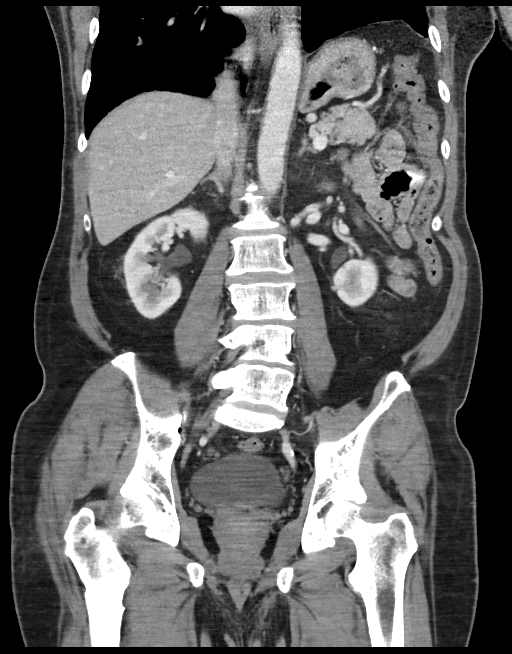
[im 47/84  soft-tissue]
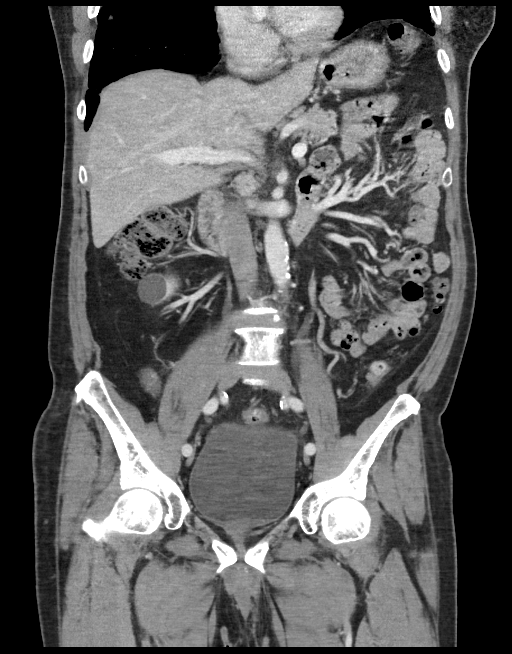

[16 of 46 positions shown; findings below may reference images not displayed]

RADIATION DOSE REDUCTION: This exam was performed according to the
departmental dose-optimization program which includes automated
exposure control, adjustment of the mA and/or kV according to
patient size and/or use of iterative reconstruction technique.

CONTRAST:  100mL OMNIPAQUE IOHEXOL 300 MG/ML  SOLN
FINDINGS: Lower chest: Mild scarring at the lung bases. No pleural effusion.
Relative elevation of the left hemidiaphragm.

Hepatobiliary: Liver parenchyma is normal.  No calcified gallstones.

Pancreas: Normal

Spleen: Normal

Adrenals/Urinary Tract: Adrenal glands are normal. Right kidney
contains a nonobstructing 2 mm stone in the lower pole. Simple
appearing cyst in the lower pole measuring 2.6 cm in diameter. Left
kidney is normal. Bladder is normal.

Stomach/Bowel: Stomach and small intestine are normal. Normal
appendix. Moderate amount of fecal matter within the right colon and
transverse colon. No sign of diverticulitis. Diverticulosis of the
left colon with mild diverticulitis of the proximal sigmoid region.
No abscess, free fluid or free air.

Vascular/Lymphatic: Aortic atherosclerosis. No aneurysm. IVC is
normal. No adenopathy.

Reproductive: Normal

Other: Bilateral inguinal hernias containing only fat.

Musculoskeletal: Ordinary lower lumbar degenerative changes.
IMPRESSION: Acute diverticulitis of the proximal sigmoid colon, mild in degree.
No evidence of abscess, free fluid or free air.

Bilateral inguinal hernias containing only fat.

Aortic atherosclerosis.

2 mm nonobstructing stone lower pole right kidney.
